# Patient Record
Sex: Male | Born: 1965 | Race: White | Hispanic: No | Smoking: Current every day smoker
Health system: Southern US, Community
[De-identification: ages and names within clinical notes are randomized; demographics above are authoritative.]

## PROBLEM LIST (undated history)

## (undated) DIAGNOSIS — R161 Splenomegaly, not elsewhere classified: Secondary | ICD-10-CM

## (undated) DIAGNOSIS — K219 Gastro-esophageal reflux disease without esophagitis: Secondary | ICD-10-CM

## (undated) DIAGNOSIS — I10 Essential (primary) hypertension: Secondary | ICD-10-CM

## (undated) DIAGNOSIS — K7460 Unspecified cirrhosis of liver: Secondary | ICD-10-CM

## (undated) DIAGNOSIS — K760 Fatty (change of) liver, not elsewhere classified: Secondary | ICD-10-CM

## (undated) HISTORY — DX: Gastro-esophageal reflux disease without esophagitis: K21.9

## (undated) HISTORY — DX: Fatty (change of) liver, not elsewhere classified: K76.0

## (undated) HISTORY — DX: Splenomegaly, not elsewhere classified: R16.1

---

## 2008-04-09 ENCOUNTER — Ambulatory Visit (HOSPITAL_COMMUNITY): Admission: RE | Admit: 2008-04-09 | Discharge: 2008-04-09 | Payer: Self-pay | Admitting: Family Medicine

## 2009-07-13 ENCOUNTER — Emergency Department (HOSPITAL_COMMUNITY): Admission: EM | Admit: 2009-07-13 | Discharge: 2009-07-13 | Payer: Self-pay | Admitting: Emergency Medicine

## 2009-09-16 ENCOUNTER — Ambulatory Visit: Payer: Self-pay | Admitting: Orthopedic Surgery

## 2009-09-16 DIAGNOSIS — G589 Mononeuropathy, unspecified: Secondary | ICD-10-CM | POA: Insufficient documentation

## 2009-09-16 DIAGNOSIS — Z8679 Personal history of other diseases of the circulatory system: Secondary | ICD-10-CM | POA: Insufficient documentation

## 2010-08-22 ENCOUNTER — Ambulatory Visit (HOSPITAL_COMMUNITY): Admission: RE | Admit: 2010-08-22 | Discharge: 2010-08-22 | Payer: Self-pay | Admitting: Urology

## 2010-11-08 ENCOUNTER — Encounter: Payer: Self-pay | Admitting: Urology

## 2012-02-10 ENCOUNTER — Emergency Department (HOSPITAL_COMMUNITY): Payer: 59

## 2012-02-10 ENCOUNTER — Other Ambulatory Visit: Payer: Self-pay

## 2012-02-10 ENCOUNTER — Emergency Department (HOSPITAL_COMMUNITY)
Admission: EM | Admit: 2012-02-10 | Discharge: 2012-02-10 | Disposition: A | Discharge status: Home / Self Care | Payer: 59 | Attending: Emergency Medicine | Admitting: Emergency Medicine

## 2012-02-10 ENCOUNTER — Encounter (HOSPITAL_COMMUNITY): Payer: Self-pay | Admitting: Emergency Medicine

## 2012-02-10 DIAGNOSIS — I10 Essential (primary) hypertension: Secondary | ICD-10-CM | POA: Insufficient documentation

## 2012-02-10 DIAGNOSIS — D696 Thrombocytopenia, unspecified: Secondary | ICD-10-CM | POA: Insufficient documentation

## 2012-02-10 DIAGNOSIS — R002 Palpitations: Secondary | ICD-10-CM | POA: Insufficient documentation

## 2012-02-10 DIAGNOSIS — R7401 Elevation of levels of liver transaminase levels: Secondary | ICD-10-CM | POA: Insufficient documentation

## 2012-02-10 DIAGNOSIS — R197 Diarrhea, unspecified: Secondary | ICD-10-CM | POA: Insufficient documentation

## 2012-02-10 DIAGNOSIS — Z79899 Other long term (current) drug therapy: Secondary | ICD-10-CM | POA: Insufficient documentation

## 2012-02-10 DIAGNOSIS — R7402 Elevation of levels of lactic acid dehydrogenase (LDH): Secondary | ICD-10-CM | POA: Insufficient documentation

## 2012-02-10 HISTORY — DX: Essential (primary) hypertension: I10

## 2012-02-10 LAB — CBC
MCH: 32.3 pg (ref 26.0–34.0)
MCV: 95 fL (ref 78.0–100.0)
Platelets: 80 10*3/uL — ABNORMAL LOW (ref 150–400)
RDW: 12.7 % (ref 11.5–15.5)

## 2012-02-10 LAB — COMPREHENSIVE METABOLIC PANEL
ALT: 84 U/L — ABNORMAL HIGH (ref 0–53)
AST: 152 U/L — ABNORMAL HIGH (ref 0–37)
Calcium: 9.6 mg/dL (ref 8.4–10.5)
Creatinine, Ser: 0.77 mg/dL (ref 0.50–1.35)
GFR calc Af Amer: >90 mL/min (ref >90)
Glucose, Bld: 129 mg/dL — ABNORMAL HIGH (ref 70–99)
Sodium: 139 mEq/L (ref 135–145)
Total Protein: 7.6 g/dL (ref 6.0–8.3)

## 2012-02-10 LAB — DIFFERENTIAL
Basophils Absolute: 0 10*3/uL (ref 0.0–0.1)
Eosinophils Absolute: 0.1 10*3/uL (ref 0.0–0.7)
Eosinophils Relative: 1 % (ref 0–5)
Monocytes Absolute: 0.4 10*3/uL (ref 0.1–1.0)

## 2012-02-10 LAB — TROPONIN I: Troponin I: <0.3 ng/mL (ref <0.30)

## 2012-02-10 MED ORDER — METOPROLOL TARTRATE 1 MG/ML IV SOLN
5.0000 mg | Freq: Once | INTRAVENOUS | Status: DC
  Filled 2012-02-10: qty 5

## 2012-02-10 MED ORDER — CLONIDINE HCL 0.1 MG PO TABS
0.1000 mg | ORAL_TABLET | Freq: Once | ORAL | Status: AC
  Administered 2012-02-10: 0.1 mg via ORAL
  Filled 2012-02-10 (×2): qty 1

## 2012-02-10 MED ORDER — HYDROCHLOROTHIAZIDE 25 MG PO TABS: 25.0000 mg | ORAL_TABLET | Freq: Every day | ORAL | Status: DC

## 2012-02-10 NOTE — ED Notes (Signed)
Pt states he felt like his heart was skipping beats at his childs soccer game.  Pt was not exerting himself at the time

## 2012-02-10 NOTE — ED Provider Notes (Signed)
History   This chart was scribed for Glynn Octave, MD by Melba Coon. The patient was seen in room APA03/APA03 and the patient's care was started at 5:35PM.    CSN: [id number]  Arrival date & time 02/10/12  1717   First MD Initiated Contact with Patient 02/10/12 1726      Chief Complaint  Patient presents with  . Palpitations    (Consider location/radiation/quality/duration/timing/severity/associated sxs/prior treatment) HPI Jacob Lester is a 46 y.o. male who presents to the Emergency Department complaining of intermittent, moderate heart palpitations with an onset an hr ago. Pt states he felt like his heart was skipping beats every few seconds at his daughter's soccer game for 30 min. Pt was not exerting himself at the time. No CP, but felt "emptiness" or "void" in his chest. Pt feels almost back to nml at time of exam but feels dehydrated. Pt has a Hx of HTN and takes meds for it. Nml appetite. Diarrhea 2x today present. No double or bluury vision. No HA, fever, lightheadedness, neck pain, sore throat, rash, back pain, SOB, abd pain, nausea, emesis, dysuria, or extremity pain, edema, weakness, numbness, or tingling. No family Hx of heart problems; no stress test done. No known allergies. No other pertinent medical symptoms. Pt is an occasional smoker.  PCP: Isac Sarna  Past Medical History  Diagnosis Date  . Hypertension     History reviewed. No pertinent past surgical history.  No family history on file.  History  Substance Use Topics  . Smoking status: Passive Smoker  . Smokeless tobacco: Not on file  . Alcohol Use: Yes      Review of Systems 10 Systems reviewed and all are negative for acute change except as noted in the HPI.   Allergies  Review of patient's allergies indicates no known allergies.  Home Medications   Current Outpatient Rx  Name Route Sig Dispense Refill  . ASPIRIN EC 81 MG PO TBEC Oral Take 81 mg by mouth daily.    Marland Kitchen FLAXSEED OIL PO  Oral Take 1 tablet by mouth daily.    Marland Kitchen METOPROLOL SUCCINATE ER 100 MG PO TB24 Oral Take 100 mg by mouth daily.    Marland Kitchen NAPHAZOLINE HCL 0.012 % OP SOLN Both Eyes Place 1 drop into both eyes daily.    Marland Kitchen FISH OIL 1200 MG PO CAPS Oral Take 1 capsule by mouth daily.    Marland Kitchen OMEPRAZOLE 20 MG PO CPDR Oral Take 20 mg by mouth every other day.      BP 200/106  Pulse 76  Temp(Src) 98.3 F (36.8 C) (Oral)  Resp 13  Ht 6' (1.829 m)  Wt 224 lb (101.606 kg)  BMI 30.38 kg/m2  SpO2 98%  Physical Exam  Nursing note and vitals reviewed. Constitutional: He is oriented to person, place, and time. He appears well-developed and well-nourished.       Awake, alert, nontoxic appearance with baseline speech for patient.  HENT:  Head: Normocephalic and atraumatic.  Mouth/Throat: No oropharyngeal exudate.  Eyes: EOM are normal. Pupils are equal, round, and reactive to light. Right eye exhibits no discharge. Left eye exhibits no discharge.       No nystagmus  Neck: Normal range of motion. Neck supple.  Cardiovascular: Normal rate, regular rhythm and normal heart sounds.   No murmur heard. Pulmonary/Chest: Effort normal and breath sounds normal. No stridor. No respiratory distress. He has no wheezes. He has no rales. He exhibits no tenderness.  Abdominal: Soft. Bowel sounds are  normal. He exhibits no mass. There is no tenderness. There is no rebound.  Musculoskeletal: He exhibits no edema and no tenderness.       Baseline ROM, moves extremities with no obvious new focal weakness.  Lymphadenopathy:    He has no cervical adenopathy.  Neurological: He is alert and oriented to person, place, and time.       Awake, alert, cooperative and aware of situation; motor strength bilaterally; sensation normal to light touch bilaterally; peripheral visual fields full to confrontation; no facial asymmetry; tongue midline; major cranial nerves appear intact; normal finger to nose bilaterally.  Skin: Skin is warm. No rash noted.    Psychiatric: He has a normal mood and affect. His behavior is normal.    ED Course  Procedures (including critical care time) DIAGNOSTIC STUDIES: Oxygen Saturation is 98% on room air, normal by my interpretation.    COORDINATION OF CARE:  5:40PM - Pt will order CXR and blood w/u for the pt and multiple BP readings with careful observation. 7:11PM - recheck; blood tests show elevated AST and ALT levels with a AST:ALT ratio of about 2, indicating possible alcohol hepatitis; pt was asked about his alcohol intake in which pt admitted to drinking about 4 beers a night every night and is aware of his liver problems. Pt advised to reduce his alcohol consumption.   Labs Reviewed  CBC - Abnormal; Notable for the following:    Platelets 80 (*)    All other components within normal limits  COMPREHENSIVE METABOLIC PANEL - Abnormal; Notable for the following:    Glucose, Bld 129 (*)    AST 152 (*)    ALT 84 (*)    All other components within normal limits  DIFFERENTIAL  TROPONIN I  TSH  T4, FREE   Dg Chest 2 View  02/10/2012  *RADIOLOGY REPORT*  Clinical Data: 46 year old male with palpitations and hypertension.  CHEST - 2 VIEW  Comparison: None.  Findings: Lung volumes are within normal limits.  Cardiac size and mediastinal contours are within normal limits.  Visualized tracheal air column is within normal limits.  The lungs are clear.  No pneumothorax, or pleural effusion. No acute osseous abnormality identified.  IMPRESSION: Negative, no acute cardiopulmonary abnormality.  Original Report Authenticated By: Harley Hallmark, M.D.     No diagnosis found.    MDM  Sensation of heart skipping beats with "void" in the throat area while watching daughter play soccer. Lasted about 30 minutes. No chest pain, shortness of breath, nausea or vomiting Electrolytes normal, EKG was sinus arrhythmia, troponin negative  Mild transaminase elevation noted. Patient states he's had this in the past and  been denied life insurance over but did not have it evaluated. AST is twice greater than ALT in the pattern of alcoholic hepatitis. His thrombocytopenia 80 also suggest chronic alcohol abuse. Patient confronted with these findings he states juice about 4 beers a night every night. I advised them this is likely the source of his transaminitis and thrombocytopenia and cautioned him to reduce his alcohol intake.  His blood pressure remains high 200/100. She takes 100 mg of Lopressor once daily. He states compliance. He denies any chest pain, shortness of breath, headache, vision change. I advised him to call his Dr. Murvin Natal first thing in the morning to be seen for his blood pressure and transaminitis issues. He'll be referred to cardiology for possible Holter monitor patient placement as an outpatient.   Date: 02/10/2012  Rate: 74  Rhythm:  sinus arrhythmia  QRS Axis: normal  Intervals: normal  ST/T Wave abnormalities: normal  Conduction Disutrbances:none  Narrative Interpretation: Left ventricular hypertrophy  Old EKG Reviewed: none available   I personally performed the services described in this documentation, which was scribed in my presence.  The recorded information has been reviewed and considered.       Glynn Octave, MD 02/11/12 [phone]

## 2012-02-10 NOTE — ED Notes (Signed)
MD at bedside. 

## 2012-02-10 NOTE — Discharge Instructions (Signed)
Arterial Hypertension Call your doctor tomorrow regarding your high blood pressure, elevated liver function studies and low platelets. Return to the ED he developed chest pain, shortness of breath, headache, visual change, any other concerning symptoms Arterial hypertension (high blood pressure) is a condition of elevated pressure in your blood vessels. Hypertension over a long period of time is a risk factor for strokes, heart attacks, and heart failure. It is also the leading cause of kidney (renal) failure.  CAUSES   In Adults -- Over 90% of all hypertension has no known cause. This is called essential or primary hypertension. In the other 10% of people with hypertension, the increase in blood pressure is caused by another disorder. This is called secondary hypertension. Important causes of secondary hypertension are:   Heavy alcohol use.   Obstructive sleep apnea.   Hyperaldosterosim (Conn's syndrome).   Steroid use.   Chronic kidney failure.   Hyperparathyroidism.   Medications.   Renal artery stenosis.   Pheochromocytoma.   Cushing's disease.   Coarctation of the aorta.   Scleroderma renal crisis.   Licorice (in excessive amounts).   Drugs (cocaine, methamphetamine).  Your caregiver can explain any items above that apply to you.  In Children -- Secondary hypertension is more common and should always be considered.   Pregnancy -- Few women of childbearing age have high blood pressure. However, up to 10% of them develop hypertension of pregnancy. Generally, this will not harm the woman. It may be a sign of 3 complications of pregnancy: preeclampsia, HELLP syndrome, and eclampsia. Follow up and control with medication is necessary.  SYMPTOMS   This condition normally does not produce any noticeable symptoms. It is usually found during a routine exam.   Malignant hypertension is a late problem of high blood pressure. It may have the following symptoms:   Headaches.    Blurred vision.   End-organ damage (this means your kidneys, heart, lungs, and other organs are being damaged).   Stressful situations can increase the blood pressure. If a person with normal blood pressure has their blood pressure go up while being seen by their caregiver, this is often termed "white coat hypertension." Its importance is not known. It may be related with eventually developing hypertension or complications of hypertension.   Hypertension is often confused with mental tension, stress, and anxiety.  DIAGNOSIS  The diagnosis is made by 3 separate blood pressure measurements. They are taken at least 1 week apart from each other. If there is organ damage from hypertension, the diagnosis may be made without repeat measurements. Hypertension is usually identified by having blood pressure readings:  Above 140/90 mmHg measured in both arms, at 3 separate times, over a couple weeks.   Over 130/80 mmHg should be considered a risk factor and may require treatment in patients with diabetes.  Blood pressure readings over 120/80 mmHg are called "pre-hypertension" even in non-diabetic patients. To get a true blood pressure measurement, use the following guidelines. Be aware of the factors that can alter blood pressure readings.  Take measurements at least 1 hour after caffeine.   Take measurements 30 minutes after smoking and without any stress. This is another reason to quit smoking - it raises your blood pressure.   Use a proper cuff size. Ask your caregiver if you are not sure about your cuff size.   Most home blood pressure cuffs are automatic. They will measure systolic and diastolic pressures. The systolic pressure is the pressure reading at the start of  sounds. Diastolic pressure is the pressure at which the sounds disappear. If you are elderly, measure pressures in multiple postures. Try sitting, lying or standing.   Sit at rest for a minimum of 5 minutes before taking  measurements.   You should not be on any medications like decongestants. These are found in many cold medications.   Record your blood pressure readings and review them with your caregiver.  If you have hypertension:  Your caregiver may do tests to be sure you do not have secondary hypertension (see "causes" above).   Your caregiver may also look for signs of metabolic syndrome. This is also called Syndrome X or Insulin Resistance Syndrome. You may have this syndrome if you have type 2 diabetes, abdominal obesity, and abnormal blood lipids in addition to hypertension.   Your caregiver will take your medical and family history and perform a physical exam.   Diagnostic tests may include blood tests (for glucose, cholesterol, potassium, and kidney function), a urinalysis, or an EKG. Other tests may also be necessary depending on your condition.  PREVENTION  There are important lifestyle issues that you can adopt to reduce your chance of developing hypertension:  Maintain a normal weight.   Limit the amount of salt (sodium) in your diet.   Exercise often.   Limit alcohol intake.   Get enough potassium in your diet. Discuss specific advice with your caregiver.   Follow a DASH diet (dietary approaches to stop hypertension). This diet is rich in fruits, vegetables, and low-fat dairy products, and avoids certain fats.  PROGNOSIS  Essential hypertension cannot be cured. Lifestyle changes and medical treatment can lower blood pressure and reduce complications. The prognosis of secondary hypertension depends on the underlying cause. Many people whose hypertension is controlled with medicine or lifestyle changes can live a normal, healthy life.  RISKS AND COMPLICATIONS  While high blood pressure alone is not an illness, it often requires treatment due to its short- and long-term effects on many organs. Hypertension increases your risk for:  CVAs or strokes (cerebrovascular accident).   Heart  failure due to chronically high blood pressure (hypertensive cardiomyopathy).   Heart attack (myocardial infarction).   Damage to the retina (hypertensive retinopathy).   Kidney failure (hypertensive nephropathy).  Your caregiver can explain list items above that apply to you. Treatment of hypertension can significantly reduce the risk of complications. TREATMENT   For overweight patients, weight loss and regular exercise are recommended. Physical fitness lowers blood pressure.   Mild hypertension is usually treated with diet and exercise. A diet rich in fruits and vegetables, fat-free dairy products, and foods low in fat and salt (sodium) can help lower blood pressure. Decreasing salt intake decreases blood pressure in a 1/3 of people.   Stop smoking if you are a smoker.  The steps above are highly effective in reducing blood pressure. While these actions are easy to suggest, they are difficult to achieve. Most patients with moderate or severe hypertension end up requiring medications to bring their blood pressure down to a normal level. There are several classes of medications for treatment. Blood pressure pills (antihypertensives) will lower blood pressure by their different actions. Lowering the blood pressure by 10 mmHg may decrease the risk of complications by as much as 25%. The goal of treatment is effective blood pressure control. This will reduce your risk for complications. Your caregiver will help you determine the best treatment for you according to your lifestyle. What is excellent treatment for one person,  may not be for you. HOME CARE INSTRUCTIONS   Do not smoke.   Follow the lifestyle changes outlined in the "Prevention" section.   If you are on medications, follow the directions carefully. Blood pressure medications must be taken as prescribed. Skipping doses reduces their benefit. It also puts you at risk for problems.   Follow up with your caregiver, as directed.   If  you are asked to monitor your blood pressure at home, follow the guidelines in the "Diagnosis" section above.  SEEK MEDICAL CARE IF:   You think you are having medication side effects.   You have recurrent headaches or lightheadedness.   You have swelling in your ankles.   You have trouble with your vision.  SEEK IMMEDIATE MEDICAL CARE IF:   You have sudden onset of chest pain or pressure, difficulty breathing, or other symptoms of a heart attack.   You have a severe headache.   You have symptoms of a stroke (such as sudden weakness, difficulty speaking, difficulty walking).  MAKE SURE YOU:   Understand these instructions.   Will watch your condition.   Will get help right away if you are not doing well or get worse.  Document Released: 10/05/2005 Document Revised: 09/24/2011 Document Reviewed: 05/05/2007 [facility] Patient Information 2012 [place], [place].Alcohol Problems Most adults who drink alcohol drink in moderation (not a lot) are at low risk for developing problems related to their drinking. However, all drinkers, including low-risk drinkers, should know about the health risks connected with drinking alcohol. RECOMMENDATIONS FOR LOW-RISK DRINKING  Drink in moderation. Moderate drinking is defined as follows:   Men - no more than 2 drinks per day.   Nonpregnant women - no more than 1 drink per day.   Over age 53 - no more than 1 drink per day.  A standard drink is 12 grams of pure alcohol, which is equal to a 12 ounce bottle of beer or wine cooler, a 5 ounce glass of wine, or 1.5 ounces of distilled spirits (such as whiskey, brandy, vodka, or rum).  ABSTAIN FROM (DO NOT DRINK) ALCOHOL:  When pregnant or considering pregnancy.   When taking a medication that interacts with alcohol.   If you are alcohol dependent.   A medical condition that prohibits drinking alcohol (such as ulcer, liver disease, or heart disease).  DISCUSS WITH YOUR CAREGIVER:  If you are at risk  for coronary heart disease, discuss the potential benefits and risks of alcohol use: Light to moderate drinking is associated with lower rates of coronary heart disease in certain populations (for example, men over age 23 and postmenopausal women). Infrequent or nondrinkers are advised not to begin light to moderate drinking to reduce the risk of coronary heart disease so as to avoid creating an alcohol-related problem. Similar protective effects can likely be gained through proper diet and exercise.   Women and the elderly have smaller amounts of body water than men. As a result women and the elderly achieve a higher blood alcohol concentration after drinking the same amount of alcohol.   Exposing a fetus to alcohol can cause a broad range of birth defects referred to as Fetal Alcohol Syndrome (FAS) or Alcohol-Related Birth Defects (ARBD). Although FAS/ARBD is connected with excessive alcohol consumption during pregnancy, studies also have reported neurobehavioral problems in infants born to mothers reporting drinking an average of 1 drink per day during pregnancy.   Heavier drinking (the consumption of more than 4 drinks per occasion by men and more  than 3 drinks per occasion by women) impairs learning (cognitive) and psychomotor functions and increases the risk of alcohol-related problems, including accidents and injuries.  CAGE QUESTIONS:   Have you ever felt that you should Cut down on your drinking?   Have people Annoyed you by criticizing your drinking?   Have you ever felt bad or Guilty about your drinking?   Have you ever had a drink first thing in the morning to steady your nerves or get rid of a hangover (Eye opener)?  If you answered positively to any of these questions: You may be at risk for alcohol-related problems if alcohol consumption is:   Men: Greater than 14 drinks per week or more than 4 drinks per occasion.   Women: Greater than 7 drinks per week or more than 3 drinks per  occasion.  Do you or your family have a medical history of alcohol-related problems, such as:  Blackouts.   Sexual dysfunction.   Depression.   Trauma.   Liver dysfunction.   Sleep disorders.   Hypertension.   Chronic abdominal pain.   Has your drinking ever caused you problems, such as problems with your family, problems with your work (or school) performance, or accidents/injuries?   Do you have a compulsion to drink or a preoccupation with drinking?   Do you have poor control or are you unable to stop drinking once you have started?   Do you have to drink to avoid withdrawal symptoms?   Do you have problems with withdrawal such as tremors, nausea, sweats, or mood disturbances?   Does it take more alcohol than in the past to get you high?   Do you feel a strong urge to drink?   Do you change your plans so that you can have a drink?   Do you ever drink in the morning to relieve the shakes or a hangover?  If you have answered a number of the previous questions positively, it may be time for you to talk to your caregivers, family, and friends and see if they think you have a problem. Alcoholism is a chemical dependency that keeps getting worse and will eventually destroy your health and relationships. Many alcoholics end up dead, impoverished, or in prison. This is often the end result of all chemical dependency.  Do not be discouraged if you are not ready to take action immediately.   Decisions to change behavior often involve up and down desires to change and feeling like you cannot decide.   Try to think more seriously about your drinking behavior.   Think of the reasons to quit.  WHERE TO GO FOR ADDITIONAL INFORMATION   The National Institute on Alcohol Abuse and Alcoholism (NIAAA)www.niaaa.nih.gov   [employer] on Alcoholism and Drug Dependence (NCADD)www.ncadd.org   American Society of Addiction Medicine (ASAM)www.https://anderson-johnson.com/  Document Released: 10/05/2005  Document Revised: 09/24/2011 Document Reviewed: 05/23/2008 [facility] Patient Information 2012 [place], [place].

## 2012-02-11 LAB — T4, FREE: Free T4: 1.36 ng/dL (ref 0.80–1.80)

## 2012-02-11 NOTE — ED Notes (Addendum)
Patient given cyclobenzaprine 10mg  - MD advised and patient informed.

## 2012-03-07 ENCOUNTER — Other Ambulatory Visit (HOSPITAL_COMMUNITY): Payer: Self-pay | Admitting: Family Medicine

## 2012-03-07 DIAGNOSIS — D473 Essential (hemorrhagic) thrombocythemia: Secondary | ICD-10-CM

## 2012-03-07 DIAGNOSIS — R7301 Impaired fasting glucose: Secondary | ICD-10-CM

## 2012-03-07 DIAGNOSIS — I10 Essential (primary) hypertension: Secondary | ICD-10-CM

## 2012-03-08 ENCOUNTER — Telehealth (HOSPITAL_COMMUNITY): Payer: Self-pay

## 2012-03-08 NOTE — Telephone Encounter (Signed)
Spoke with patient and knows to come for appointment with Dr. Mariel Sleet on 5/28 @ 8:10am.  Appointment for 6/3 cancelled.

## 2012-03-09 ENCOUNTER — Ambulatory Visit (HOSPITAL_COMMUNITY)
Admission: RE | Admit: 2012-03-09 | Discharge: 2012-03-09 | Disposition: A | Discharge status: Home / Self Care | Payer: BC Managed Care – PPO | Source: Ambulatory Visit | Attending: Family Medicine | Admitting: Family Medicine

## 2012-03-09 DIAGNOSIS — R7301 Impaired fasting glucose: Secondary | ICD-10-CM | POA: Insufficient documentation

## 2012-03-09 DIAGNOSIS — K8020 Calculus of gallbladder without cholecystitis without obstruction: Secondary | ICD-10-CM | POA: Insufficient documentation

## 2012-03-09 DIAGNOSIS — K7689 Other specified diseases of liver: Secondary | ICD-10-CM | POA: Insufficient documentation

## 2012-03-09 DIAGNOSIS — D473 Essential (hemorrhagic) thrombocythemia: Secondary | ICD-10-CM | POA: Insufficient documentation

## 2012-03-09 DIAGNOSIS — I10 Essential (primary) hypertension: Secondary | ICD-10-CM | POA: Insufficient documentation

## 2012-03-09 DIAGNOSIS — R748 Abnormal levels of other serum enzymes: Secondary | ICD-10-CM | POA: Insufficient documentation

## 2012-03-11 ENCOUNTER — Encounter (HOSPITAL_COMMUNITY): Payer: Self-pay | Admitting: Oncology

## 2012-03-11 ENCOUNTER — Encounter (HOSPITAL_COMMUNITY): Payer: BC Managed Care – PPO | Attending: Oncology | Admitting: Oncology

## 2012-03-11 VITALS — BP 172/93 | HR 68 | Temp 98.2°F | Ht 72.0 in | Wt 219.8 lb

## 2012-03-11 DIAGNOSIS — R748 Abnormal levels of other serum enzymes: Secondary | ICD-10-CM | POA: Insufficient documentation

## 2012-03-11 DIAGNOSIS — R161 Splenomegaly, not elsewhere classified: Secondary | ICD-10-CM | POA: Insufficient documentation

## 2012-03-11 DIAGNOSIS — K219 Gastro-esophageal reflux disease without esophagitis: Secondary | ICD-10-CM | POA: Insufficient documentation

## 2012-03-11 DIAGNOSIS — K7689 Other specified diseases of liver: Secondary | ICD-10-CM | POA: Insufficient documentation

## 2012-03-11 DIAGNOSIS — E663 Overweight: Secondary | ICD-10-CM | POA: Insufficient documentation

## 2012-03-11 DIAGNOSIS — I10 Essential (primary) hypertension: Secondary | ICD-10-CM | POA: Insufficient documentation

## 2012-03-11 DIAGNOSIS — F3289 Other specified depressive episodes: Secondary | ICD-10-CM | POA: Insufficient documentation

## 2012-03-11 DIAGNOSIS — F329 Major depressive disorder, single episode, unspecified: Secondary | ICD-10-CM | POA: Insufficient documentation

## 2012-03-11 DIAGNOSIS — D696 Thrombocytopenia, unspecified: Secondary | ICD-10-CM | POA: Insufficient documentation

## 2012-03-11 DIAGNOSIS — F411 Generalized anxiety disorder: Secondary | ICD-10-CM | POA: Insufficient documentation

## 2012-03-11 DIAGNOSIS — F1010 Alcohol abuse, uncomplicated: Secondary | ICD-10-CM | POA: Insufficient documentation

## 2012-03-11 LAB — DIFFERENTIAL
Eosinophils Relative: 3 % (ref 0–5)
Lymphs Abs: 2.1 10*3/uL (ref 0.7–4.0)
Neutrophils Relative %: 49 % (ref 43–77)

## 2012-03-11 LAB — CBC
MCH: 32.9 pg (ref 26.0–34.0)
MCHC: 35.8 g/dL (ref 30.0–36.0)
MCV: 92.1 fL (ref 78.0–100.0)
Platelets: 87 10*3/uL — ABNORMAL LOW (ref 150–400)
RDW: 13.2 % (ref 11.5–15.5)

## 2012-03-11 LAB — SEDIMENTATION RATE: Sed Rate: 2 mm/hr (ref 0–16)

## 2012-03-11 LAB — HEPATIC FUNCTION PANEL
Albumin: 4.2 g/dL (ref 3.5–5.2)
Indirect Bilirubin: 1 mg/dL — ABNORMAL HIGH (ref 0.3–0.9)
Total Protein: 8.1 g/dL (ref 6.0–8.3)

## 2012-03-11 LAB — GAMMA GT: GGT: 361 U/L — ABNORMAL HIGH (ref 7–51)

## 2012-03-11 NOTE — Progress Notes (Signed)
This office note has been dictated.

## 2012-03-11 NOTE — Progress Notes (Signed)
CC:   Jacob Lester, M.D.  DIAGNOSES: 1. Thrombocytopenia in the setting of abnormal liver enzymes and both     clinical and radiographic evidence for splenomegaly. 2. Fatty infiltration of the liver. 3. Excessive weight, weighing 219 pounds on a 6 foot 0 inch frame, and     his body mass index is 29.9. 4. Excessive use of alcohol at this point in time, drinking 6 beers a     day or 3 cocktails every evening. 5. Anxiety and mild depression over the illness of his mother, who has     metastatic cancer. 6. Hypertension, on hydrochlorothiazide 25 mg a day. 7. Gastroesophageal reflux disease, occasionally on omeprazole, and he     also takes Benicar HCT for his hypertension. This is a very pleasant gentleman, who I have met before.  His mother is my patient and a delightful woman.  He is a very pleasant gentleman, who was found by Dr. Phillips Odor to have abnormal blood work in the form of thrombocytopenia with platelets of less than 100,000 recently, but more interestingly, he had liver enzymes which were abnormal as far as SGOT, SGPT and a gamma GT.  He is not having bleeding issues from the nose, gums, GI or GU tracts. He feels fairly well, but he is a little down about his mother.  He has not had abdominal surgery but he has had 2 episodes of diverticulitis, he states.  He had an ultrasound of his abdomen, which specifically did not reveal "splenomegaly."  He had cholelithiasis, but he had CAT scans in 2011 and using measurements, he definitely has splenomegaly on that evaluation, plus he had fatty infiltration of the liver at that time as well, on this ultrasound evaluation he also had fatty infiltration of his liver seen diffusely by Dr. Molli Posey in radiology.  He does not smoke.  He works full-time.  He has basically a sit-down job.  His is very busy, however, but does not get any exercise to speak of and actually weighed a little bit more at 1 time, up to about 230 pounds, but  is down to 219 pounds, in high school weighed about 165 pounds.  PHYSICAL EXAMINATION:  Very, pleasant gentleman, alert, oriented, in no acute distress.  He denies any pain.  His weight is 219 pounds on a 6 foot 0 inch frame.  Blood pressure 172/93, but he is anxious today.  I tis in the right arm, sitting position.  Pulse 68 and regular, respirations 16 and unlabored.  He is afebrile.  Lymph nodes:  Negative throughout.  Lungs:  Clear.  Heart:  Shows a regular rhythm and rate without murmur, rub or gallop.  Breast exam shows no gynecomastia. Abdomen:  Soft and nontender, but he has a spleen which is 3-4 cm below the costal margin in the supine position, easily felt in the left upper quadrant.  His liver comes down about 2 cm below the right costal margin on deep inspiration as well.  He has no peripheral edema, no petechiae, no ecchymoses.  Bowel sounds are normal.  He has a normal HEENT exam. Throat is clear.  No petechiae orally.  Pupils equally round and reactive to light.  There are no conjunctival petechiae.  He has no thyromegaly.  Teeth in good repair.  I repeated his labs today, which still show thrombocytopenia with a level of 87,000, normal hemoglobin, normal white count, normal differential.  Sed rate of 2.  Normal B12 level, normal folic acid level,  normal ferritin; in fact, it is slightly high at 327, interestingly.  Here again, his GGT is high at 361.  Total bilirubin actually is high at 1.4, that is minimally elevated compared to his past levels, which were basically within the normal range from Dr. Lamar Blinks labs; and his SGOT is 120, SGPT 71, both higher than they were earlier this month.  His total protein is 8.1, his albumin 4.2.  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  He may also have a contributing factor of alcohol toxicity to his bone marrow that may be very  real.  What I have asked him to do is: 1. Think about seeing a nutritionist for weight reduction, dietary     changes and lifestyle changes. 2. Exercise. 3. Abstinence from alcohol for at least 8 weeks and bring him back to     see me in July with a repeat CBC, and I think he needs to be     considered for a GI consultation with Dr. Lionel December. If he cannot get his weight off and this splenomegaly and fatty infiltration of the liver continue, I think he is a prime candidate for cirrhosis of the liver.  So we will see him back in July and I will consider sending him to Dr. Karilyn Cota after I see him.    ______________________________ Ladona Horns. Mariel Sleet, MD ESN/MEDQ  D:  03/11/2012  T:  03/11/2012  Job:  [id number]

## 2012-03-11 NOTE — Patient Instructions (Addendum)
BRAYLON GRENDA  [id number] [birth year] Dr. Glenford Peers   [facility]  Discharge Instructions  RECOMMENDATIONS MADE BY THE CONSULTANT AND ANY TEST RESULTS WILL BE SENT TO YOUR REFERRING DOCTOR.   EXAM FINDINGS BY MD TODAY AND SIGNS AND SYMPTOMS TO REPORT TO CLINIC OR PRIMARY MD: Exam and discussion per MD.  We will make a nutritional consult regarding life style changes and good nutrition.  Avoid alcohol.  MEDICATIONS PRESCRIBED: none    INSTRUCTIONS GIVEN AND DISCUSSED: Other :  Report unusual bruising or bleeding.  If you have bleeding that is not controlled by direct pressure go to ED  SPECIAL INSTRUCTIONS/FOLLOW-UP: Lab work Needed in July and Return to Clinic on after blood work in July.   I acknowledge that I have been informed and understand all the instructions given to me and received a copy. I do not have any more questions at this time, but understand that I may call the Specialty Clinic at [facility] at [phone] during business hours should I have any further questions or need assistance in obtaining follow-up care.    __________________________________________  _____________  __________ Signature of Patient or Authorized Representative            Date                   Time    __________________________________________ Nurse's Signature

## 2012-03-15 ENCOUNTER — Ambulatory Visit (HOSPITAL_COMMUNITY): Payer: 59 | Admitting: Oncology

## 2012-03-15 ENCOUNTER — Telehealth (HOSPITAL_COMMUNITY): Payer: Self-pay | Admitting: Dietician

## 2012-03-15 LAB — HEPATITIS PANEL, ACUTE
HCV Ab: NEGATIVE
Hep A IgM: NEGATIVE

## 2012-03-15 NOTE — Telephone Encounter (Signed)
Received referral via fax from Dr. Mariel Sleet [facility]) for dx: lifestyle changes and nutrition.

## 2012-03-15 NOTE — Progress Notes (Signed)
Will also check alpha feto protein in July.

## 2012-03-15 NOTE — Telephone Encounter (Signed)
Appointment scheduled for 03/25/12 at 10:30 AM.

## 2012-03-17 ENCOUNTER — Telehealth (HOSPITAL_COMMUNITY): Payer: Self-pay | Admitting: Dietician

## 2012-03-17 NOTE — Telephone Encounter (Signed)
Mailed confirmation letter and instructions for appointment scheduled for 03/25/12 at 10:30 AM to pt home vis [place] [facility].

## 2012-03-21 ENCOUNTER — Ambulatory Visit (HOSPITAL_COMMUNITY): Payer: 59

## 2012-03-25 ENCOUNTER — Encounter (HOSPITAL_COMMUNITY): Payer: Self-pay | Admitting: Dietician

## 2012-03-25 NOTE — Progress Notes (Signed)
Outpatient Initial Nutrition Assessment  Date:03/25/2012   Time: 10:30 AM  Referring Physician: Dr. Mariel Sleet Reason for Visit: "lifestyle changes and nutrition"  Nutrition Assessment:  Height: 6\' 1"  (185.4 cm)   Weight: 218 lb (98.884 kg)   IBW: 184# %IBW: 118% UBW: 225# %UBW: 103% Body mass index is 28.76 kg/(m^2).  Goal Weight: 200# Weight hx: Pt reports UBW between 223-225#. He reports his heaviest weight was 234# about 1 year ago. His lowest weight was 175-180, about 25 years ago.   Estimated nutritional needs: 2242-2446 kcals daily, 79-99 grams protein daily, 2.2-2.4 L fluid daily  PMH:  Past Medical History  Diagnosis Date  . Hypertension     Medications:  Current Outpatient Rx  Name Route Sig Dispense Refill  . ASPIRIN EC 81 MG PO TBEC Oral Take 81 mg by mouth daily.    Marland Kitchen DIAZEPAM 2 MG PO TABS Oral Take 2 mg by mouth daily.    Marland Kitchen FLAXSEED OIL PO Oral Take 1 tablet by mouth daily.    Marland Kitchen HYDROCHLOROTHIAZIDE 25 MG PO TABS Oral Take 1 tablet (25 mg total) by mouth daily. 10 tablet 0  . METOPROLOL SUCCINATE ER 100 MG PO TB24 Oral Take 100 mg by mouth daily.    Marland Kitchen NAPHAZOLINE HCL 0.012 % OP SOLN Both Eyes Place 1 drop into both eyes daily.    Marland Kitchen OLMESARTAN MEDOXOMIL-HCTZ 40-25 MG PO TABS Oral Take 1 tablet by mouth daily.    Marland Kitchen FISH OIL 1200 MG PO CAPS Oral Take 1 capsule by mouth daily.    Marland Kitchen OMEPRAZOLE 20 MG PO CPDR Oral Take 20 mg by mouth every other day.      Labs: CMP     Component Value Date/Time   NA 139 02/10/2012 1747   K 4.1 02/10/2012 1747   CL 98 02/10/2012 1747   CO2 30 02/10/2012 1747   GLUCOSE 129* 02/10/2012 1747   BUN 12 02/10/2012 1747   CREATININE 0.77 02/10/2012 1747   CALCIUM 9.6 02/10/2012 1747   PROT 8.1 03/11/2012 0815   ALBUMIN 4.2 03/11/2012 0815   AST 120* 03/11/2012 0815   ALT 71* 03/11/2012 0815   ALKPHOS 75 03/11/2012 0815   BILITOT 1.4* 03/11/2012 0815   GFRNONAA >90 02/10/2012 1747   GFRAA >90 02/10/2012 1747    Lipid Panel  No results found for  this basename: chol, trig, hdl, cholhdl, vldl, ldlcalc     No results found for this basename: HGBA1C   Lab Results  Component Value Date   CREATININE 0.77 02/10/2012     Lifestyle/ social habits: Mr. Bilyk is a very pleasant gentleman who lives in [place] with his wife, Sue Lush (who is present with him today) and 3 daughters (ages 3, 55, and 6). 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 He does not smoke, but admits to daily alcohol use, which amount varies per day (most recent doctor's note reports as much as 6 beers a day or 3 cocktails an evening). He reports his stress level as an 8, citing work and providing for his family as his main sources of stress.   Nutrition hx/habits: Mr. Wolk reports his biggest barrier to portion control. He reports he eats healthy, but eats too much. He reports that he recently has been trying to stay away from starchy foods and "bad carbs like potatoes". He is concerned about carbohydrate contents in foods as he has a family hx of diabetes and cancer. He reports he does not currently exercise, but used  to in the past (mostly cardio). 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 He mainly drinks water. He mainly eats chicken, fish, and vegetables. He reports he buys free range eggs and buys all of his beef in bulk once a year from a farmer in [place]. He has refrained from eating fast food and soft drinks. He eats out about once a week, at either a [place] or [address]. He extremely excited about his upcoming trip to [place]. [place] for 8 days, where his family is renting a house and they will be preparing all their food.   Diet recall: Breakfast (8-10 AM): 2 eggs, piece of sausage, banana, apple, water; Lunch (1:30-2 PM): chilled chicken and vegetables; Dinner: same as lunch  Nutrition Diagnosis: Nutrition knowledge deficit r/t weight loss AEB pt and wife with multiple diet related questions.   Nutrition Intervention: Nutrition rx: 1800 kcal NAS, no added sugar diet; 3 meals  per day; low calorie beverages only; 30 minutes physical activity daily; limit alcoholic beverages and sweets  Education/Counseling Provided: Educated pt and pt wife on weight loss principles. Discussed slow, moderate weight loss of 1-2# per week. Also discussed how small amounts of weight loss (5-10% total weight) can have a large impact on health. Discussed importance of regular physical activity along with a healthy diet to assist with weight loss. Emphasized plate method. Discussed portion control, healthy food preparation methods, whole grains, low fat dairy, and incorporating more fruits and vegetables in diet. Discussed snack ideas. Showed pt functionality of MyFitnessPal app. Provided plate method handout.   Understanding, Motivation, Ability to Follow Recommendations: Expect fair to good compliance.   Monitoring and Evaluation: Goals: 1) 1-2# weight loss per week; 2) 30 minutes physical activity daily  Recommendations: 1) For weight loss: 1742-1946 kcals daily; 2) Find an enjoyable exercise and make it a priority; 3) Keep food diary (ex. MyFitnessPal)  F/U: PRN. Provided RD contact information.   Orlene Plum, RD  03/25/2012  Time: 10:30 AM

## 2012-05-09 ENCOUNTER — Other Ambulatory Visit (HOSPITAL_COMMUNITY): Payer: BC Managed Care – PPO

## 2012-05-11 ENCOUNTER — Ambulatory Visit (HOSPITAL_COMMUNITY): Payer: BC Managed Care – PPO | Admitting: Oncology

## 2012-05-17 ENCOUNTER — Telehealth (HOSPITAL_COMMUNITY): Payer: Self-pay | Admitting: Oncology

## 2012-06-10 ENCOUNTER — Encounter (HOSPITAL_COMMUNITY): Payer: BC Managed Care – PPO | Attending: Oncology

## 2012-06-10 DIAGNOSIS — K760 Fatty (change of) liver, not elsewhere classified: Secondary | ICD-10-CM

## 2012-06-10 DIAGNOSIS — R599 Enlarged lymph nodes, unspecified: Secondary | ICD-10-CM | POA: Insufficient documentation

## 2012-06-10 DIAGNOSIS — K219 Gastro-esophageal reflux disease without esophagitis: Secondary | ICD-10-CM | POA: Insufficient documentation

## 2012-06-10 DIAGNOSIS — D696 Thrombocytopenia, unspecified: Secondary | ICD-10-CM | POA: Insufficient documentation

## 2012-06-10 DIAGNOSIS — R161 Splenomegaly, not elsewhere classified: Secondary | ICD-10-CM | POA: Insufficient documentation

## 2012-06-10 DIAGNOSIS — K7460 Unspecified cirrhosis of liver: Secondary | ICD-10-CM | POA: Insufficient documentation

## 2012-06-10 DIAGNOSIS — I10 Essential (primary) hypertension: Secondary | ICD-10-CM | POA: Insufficient documentation

## 2012-06-10 LAB — GAMMA GT: GGT: 310 U/L — ABNORMAL HIGH (ref 7–51)

## 2012-06-10 LAB — CBC
Hemoglobin: 15.5 g/dL (ref 13.0–17.0)
MCV: 90.1 fL (ref 78.0–100.0)
Platelets: 83 10*3/uL — ABNORMAL LOW (ref 150–400)
RBC: 4.74 MIL/uL (ref 4.22–5.81)
WBC: 4.3 10*3/uL (ref 4.0–10.5)

## 2012-06-10 LAB — HEPATIC FUNCTION PANEL
ALT: 71 U/L — ABNORMAL HIGH (ref 0–53)
AST: 84 U/L — ABNORMAL HIGH (ref 0–37)
Albumin: 4.3 g/dL (ref 3.5–5.2)
Total Bilirubin: 1.2 mg/dL (ref 0.3–1.2)

## 2012-06-10 LAB — AFP TUMOR MARKER: AFP-Tumor Marker: 3.9 ng/mL (ref 0.0–8.0)

## 2012-06-10 NOTE — Progress Notes (Signed)
Labs drawn today for liver,afp,cbc,gamma gt

## 2012-06-13 ENCOUNTER — Encounter (HOSPITAL_BASED_OUTPATIENT_CLINIC_OR_DEPARTMENT_OTHER): Payer: BC Managed Care – PPO | Admitting: Oncology

## 2012-06-13 ENCOUNTER — Encounter (HOSPITAL_COMMUNITY): Payer: Self-pay | Admitting: Oncology

## 2012-06-13 VITALS — BP 141/82 | HR 64 | Temp 98.0°F | Resp 16 | Wt 211.0 lb

## 2012-06-13 DIAGNOSIS — R161 Splenomegaly, not elsewhere classified: Secondary | ICD-10-CM

## 2012-06-13 DIAGNOSIS — D696 Thrombocytopenia, unspecified: Secondary | ICD-10-CM

## 2012-06-13 NOTE — Patient Instructions (Addendum)
Jacob Lester  DOB Nov 19, 1965 CSN [id number]  MRN [id number] Dr. Glenford Peers   [facility]  Discharge Instructions  RECOMMENDATIONS MADE BY THE CONSULTANT AND ANY TEST RESULTS WILL BE SENT TO YOUR REFERRING DOCTOR.   EXAM FINDINGS BY MD TODAY AND SIGNS AND SYMPTOMS TO REPORT TO CLINIC OR PRIMARY MD: Some of your blood work has improved some is unchanged.  Need to check some additional labs and get a CT scan of your abdomen and pelvis to check the size of your spleen and your liver.  The low platelet count is probably due to your enlarged spleen.  Continue with weight loss and decreasing your alcohol intake.  MEDICATIONS PRESCRIBED: none   INSTRUCTIONS GIVEN AND DISCUSSED: Other:  Prep for CT scan discussed  SPECIAL INSTRUCTIONS/FOLLOW-UP: Xray Studies Needed:  CT of abdomen and pelvis this week, to see Dr. Karilyn Cota and Return to Clinic in 4 months for follow-up.   I acknowledge that I have been informed and understand all the instructions given to me and received a copy. I do not have any more questions at this time, but understand that I may call the Specialty Clinic at [facility] at [phone] during business hours should I have any further questions or need assistance in obtaining follow-up care.    __________________________________________  _____________  __________ Signature of Patient or Authorized Representative            Date                   Time    __________________________________________ Nurse's Signature

## 2012-06-13 NOTE — Progress Notes (Signed)
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 I have asked him to quit using alcohol. His liver enzymes are slightly better weight is down about 8 pounds but he remains thrombocytopenic. I've discussed his case with Dr. Karilyn Cota and we will get a formal CT of the abdomen and pelvis with contrast since it has been 2 years since a CT scan was performed to rule out diverticulosis at that time. We will also get an HIV test to complete the thrombocytopenia workup. His hepatitis panel was negative. His hemoglobin and white count remain normal. His alpha-fetoprotein was normal.  He therefore needs to see Dr. Karilyn Cota in consultation and we will schedule a CT scan. I've gone over his former CT scan with him which did indeed show fatty infiltration of the liver and splenomegaly at that time. I also went over his ultrasound with Dr. Tyron Russell who clearly feels that the spleen is roughly 18 cm in length. I will see him in 4 months in followup and have encouraged him to again continued to lose weight and to stop using alcohol.

## 2012-06-15 ENCOUNTER — Encounter (INDEPENDENT_AMBULATORY_CARE_PROVIDER_SITE_OTHER): Payer: Self-pay | Admitting: *Deleted

## 2012-06-16 ENCOUNTER — Ambulatory Visit (HOSPITAL_COMMUNITY)
Admission: RE | Admit: 2012-06-16 | Discharge: 2012-06-16 | Disposition: A | Discharge status: Home / Self Care | Payer: BC Managed Care – PPO | Source: Ambulatory Visit | Attending: Oncology | Admitting: Oncology

## 2012-06-16 DIAGNOSIS — R161 Splenomegaly, not elsewhere classified: Secondary | ICD-10-CM | POA: Insufficient documentation

## 2012-06-16 DIAGNOSIS — K766 Portal hypertension: Secondary | ICD-10-CM | POA: Insufficient documentation

## 2012-06-16 DIAGNOSIS — D696 Thrombocytopenia, unspecified: Secondary | ICD-10-CM | POA: Insufficient documentation

## 2012-06-16 DIAGNOSIS — K7460 Unspecified cirrhosis of liver: Secondary | ICD-10-CM | POA: Insufficient documentation

## 2012-06-16 MED ORDER — IOHEXOL 300 MG/ML  SOLN: 100.0000 mL | Freq: Once | INTRAMUSCULAR | Status: AC | PRN

## 2012-06-16 NOTE — Progress Notes (Signed)
Apt has been scheduled for 06/28/12 at 2:15 pm with Dr. Karilyn Cota.

## 2012-06-28 ENCOUNTER — Ambulatory Visit (INDEPENDENT_AMBULATORY_CARE_PROVIDER_SITE_OTHER): Payer: BC Managed Care – PPO | Admitting: Internal Medicine

## 2012-06-28 ENCOUNTER — Encounter (INDEPENDENT_AMBULATORY_CARE_PROVIDER_SITE_OTHER): Payer: Self-pay | Admitting: Internal Medicine

## 2012-06-28 VITALS — BP 150/90 | HR 80 | Temp 97.7°F | Resp 20 | Ht 72.0 in | Wt 206.3 lb

## 2012-06-28 DIAGNOSIS — K219 Gastro-esophageal reflux disease without esophagitis: Secondary | ICD-10-CM | POA: Insufficient documentation

## 2012-06-28 DIAGNOSIS — K7460 Unspecified cirrhosis of liver: Secondary | ICD-10-CM | POA: Insufficient documentation

## 2012-06-28 DIAGNOSIS — D696 Thrombocytopenia, unspecified: Secondary | ICD-10-CM | POA: Insufficient documentation

## 2012-06-28 MED ORDER — LORAZEPAM 0.5 MG PO TABS: 0.5000 mg | ORAL_TABLET | Freq: Three times a day (TID) | ORAL | Status: AC | PRN

## 2012-06-28 NOTE — Progress Notes (Signed)
Reason for consultation;  Further evaluation of new diagnosis of cirrhosis.  History of present illness; Jacob Lester is 46 year old Caucasian male who is referred through courtesy of Dr. Laurie Lester for GI evaluation. Patient was evaluated in emergency room on 02/10/2012 for weight chest symptoms and noted to have platelet count of 80,000. He was therefore referred to Dr. Laurie Lester because of thrombocytopenia. Dr. Laurie Lester noted his transaminases are elevated on its tip to the emergency room. He was therefore further evaluated. His GGT was 361(7-51). Wire markers for A., hep B and C. were negative. Serum B12 and folate levels were normal and ferritin was mildly elevated at 327 ng/ml. Also had upper abdominal ultrasound and 03/09/2012 and noted to have cholelithiasis and an echogenic liver. Repeat blood work on 06/10/2012 was pertinent for platelet count of 83,000, GGT of 310, AST 84 and ALT 70 and serum albumin was 4.3. His alpha-fetoprotein was 3.9. Finally he underwent abdominopelvic CT on 06/16/2012 revealing a Jacob Lester sick appearing liver with portal hypertension, portal venous collaterals and splenomegaly he had small stones in gallbladder. With this information patient with suspected to have chronic liver disease resulting in thrombocytopenia and referred for further evaluation. Patient denies abdominal pain, bowel distention or lower extremity edema. He was advised by Dr. Laurie Lester to quit drinking alcohol. He states he has dropped from 7-8 cans of beer a day to 3. He states his heartburn is well controlled with PPI and he denies dysphagia nausea or vomiting. He has very good appetite, however these changes even habits in an effort to lose weight. He has lost 13 pounds in the last 3 months. The most he's ever weighed was 234 pounds one year ago. He denies history of jaundice or blood transfusion. Family history is negative for chronic liver disease. He also denies epistaxis, occasional bleed, ecchymosis, melena or  rectal bleeding. He has never been vaccinated for hepatitis A or B. He has prescription for low-dose Valium but he is taken at that he occasionally when he is upset or anxious on account of his mother's illness.     Current Medications: Current Outpatient Prescriptions  Medication Sig Dispense Refill  . aspirin EC 81 MG tablet Take 81 mg by mouth daily.      . Flaxseed, Linseed, (FLAXSEED OIL) 1200 MG CAPS Take by mouth daily.      . metoprolol succinate (TOPROL-XL) 100 MG 24 hr tablet Take 100 mg by mouth daily.      . naphazoline (CLEAR EYES) 0.012 % ophthalmic solution Place 1 drop into both eyes daily.      Marland Kitchen olmesartan-hydrochlorothiazide (BENICAR HCT) 40-25 MG per tablet Take 1 tablet by mouth daily.      . Omega-3 Fatty Acids (FISH OIL) 1200 MG CAPS Take 1 capsule by mouth daily.      Marland Kitchen omeprazole (PRILOSEC) 20 MG capsule Take 20 mg by mouth every other day.      . diazepam (VALIUM) 2 MG tablet Take 2 mg by mouth daily.      . hydrochlorothiazide (HYDRODIURIL) 25 MG tablet Take 1 tablet (25 mg total) by mouth daily.  10 tablet  0   Past medical history; Hypertension of 9 years duration. Chronic GERD. History of obesity with peak rate of 234 pounds one year ago. Allergies; NKA. Family history; Mother is in her 48s and been treated for metastatic ovarian carcinoma. Father has been treated for prostate CA and remains in remission. He has 2 brothers in good health. Social history; He is married and  has 3 healthy daughters ages 57, 51 and 17. He quit cigarette smoking in September 2004. 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 He has been drinking alcohol for several years. He was drinking 7-8 cans of beer a day and now down to 3 per day.  Objective: Blood pressure 150/90, pulse 80, temperature 97.7 F (36.5 C), temperature source Oral, resp. rate 20, height 6' (1.829 m), weight 206 lb 4.8 oz (93.577 kg). Patient is alert and in no acute  distress. Asterixis is absent. Conjunctiva is pink. Sclera is nonicteric Oropharyngeal mucosa is normal. No neck masses or thyromegaly noted. Single spider angioma noted over the right shoulder posteriorly. Chest symmetrical without gynecomastia. Cardiac exam with regular rhythm normal S1 and S2. He has faint systolic ejection murmur at LLSB. Lungs are clear to auscultation. Abdomen abdomen is protuberant. Bowel sounds are normal. It is soft on palpation with easily palpable spleen. Liver edge is indistinct. Span is 14-15 cm. Shifting dullness is absent.  No LE edema or clubbing noted.  Labs/studies Results: Lab studies reviewed in history and physical. His WBC has ranged between 4.3 and 5.6. Both ultrasound and abdominopelvic CT films reviewed with the patient.   Assessment:  #1. New diagnosis of cirrhosis. Etiology would appear to be excessive alcohol use over a period of several years. Additional mechanism would be non-alcoholic fatty liver given his obesity there is no way to [place] which blade more significant role. He has splenomegaly and portal venous collaterals in the care of of portal hypertension. At some point he will need to be assessed for esophageal varices and primary prophylaxis instituted if he is found to have esophageal or gastric varices. Elevated transaminases would suggest ongoing hepatocellular injury this should improve alcohol abstinence and continued weight reduction. I do not believe liver biopsy at this point would necessarily help in his management. If transaminases do not return to normal within the next 3 months will check HCV RNA, alpha-1 anti-trypsin and autoimmune markers. Thrombocytopenia out what appeared to be secondary to chronic liver disease and usually indicative of advanced disease; however he has normal serum albumin. I believe his INR would be normal and will be checked at his next blood. #2. GERD. Symptoms well controlled with therapy. #3.  Stress and anxiety secondary to mother's serious illness for which she is using low-dose Valium sporadically. He needs to be switched to lorazepam given underlying liver disease.   Recommendations; He needs to quit drinking alcohol altogether to improve his prognosis. Discontinue Valium. Lorazepam 0.5 mg by mouth 3 times a day when necessary be used both for stress and anxiety and should he develop withdrawal symptoms. He will talk with Dr. Dorthey Sawyer about getting hepatitis A and B. Vaccination. Patient advised to refrain from eating raw fish cause of risk of vibrio vulnificus infection. Patient advised to increase physical activity and should gradually get his way down to about 190 pounds. MVI containing folic acid and thiamine daily. No acetaminophen until transaminases have returned to normal. Will plan EGD after next visit. He would also have INR prior to his next visit. Have alpha-fetoprotein and ultrasound for screening in 6 months. Patient,s  questions were answered and should he have any more he should feel free to call me. Next office visit in 2 months.   Copy to Dr.Eric Neijstrom. Copy to Dr. Dorthey Sawyer.

## 2012-06-28 NOTE — Patient Instructions (Addendum)
Take B complex tablet one daily(folate acid and thiamine). Remember not to eat raw fish. Discontinue Valium. Use lorazepam for anxiety or stress. You must quit drinking alcohol altogether.

## 2012-06-30 ENCOUNTER — Telehealth (INDEPENDENT_AMBULATORY_CARE_PROVIDER_SITE_OTHER): Payer: Self-pay | Admitting: *Deleted

## 2012-06-30 DIAGNOSIS — K760 Fatty (change of) liver, not elsewhere classified: Secondary | ICD-10-CM

## 2012-06-30 NOTE — Telephone Encounter (Signed)
Per Dr.Rehman Mr,Jacob Lester will need to have the following labs prior to his office appointment on November 02/2012. Labs have been noted for November 10-2011. Patient will be sent a letter as a reminder.

## 2012-07-04 ENCOUNTER — Ambulatory Visit (INDEPENDENT_AMBULATORY_CARE_PROVIDER_SITE_OTHER): Payer: BC Managed Care – PPO | Admitting: Internal Medicine

## 2012-08-17 ENCOUNTER — Encounter (INDEPENDENT_AMBULATORY_CARE_PROVIDER_SITE_OTHER): Payer: Self-pay | Admitting: *Deleted

## 2012-08-23 ENCOUNTER — Ambulatory Visit (INDEPENDENT_AMBULATORY_CARE_PROVIDER_SITE_OTHER): Payer: BC Managed Care – PPO | Admitting: Internal Medicine

## 2012-10-17 ENCOUNTER — Ambulatory Visit (HOSPITAL_COMMUNITY): Payer: BC Managed Care – PPO | Admitting: Oncology

## 2012-10-25 ENCOUNTER — Encounter (HOSPITAL_COMMUNITY): Payer: BC Managed Care – PPO | Attending: Oncology | Admitting: Oncology

## 2012-10-25 VITALS — BP 186/99 | HR 71 | Temp 97.9°F | Resp 18 | Wt 211.0 lb

## 2012-10-25 DIAGNOSIS — I10 Essential (primary) hypertension: Secondary | ICD-10-CM | POA: Insufficient documentation

## 2012-10-25 DIAGNOSIS — F411 Generalized anxiety disorder: Secondary | ICD-10-CM

## 2012-10-25 DIAGNOSIS — D696 Thrombocytopenia, unspecified: Secondary | ICD-10-CM | POA: Insufficient documentation

## 2012-10-25 DIAGNOSIS — K7030 Alcoholic cirrhosis of liver without ascites: Secondary | ICD-10-CM | POA: Insufficient documentation

## 2012-10-25 DIAGNOSIS — K7460 Unspecified cirrhosis of liver: Secondary | ICD-10-CM

## 2012-10-25 DIAGNOSIS — K766 Portal hypertension: Secondary | ICD-10-CM | POA: Insufficient documentation

## 2012-10-25 DIAGNOSIS — K219 Gastro-esophageal reflux disease without esophagitis: Secondary | ICD-10-CM | POA: Insufficient documentation

## 2012-10-25 LAB — COMPREHENSIVE METABOLIC PANEL
Albumin: 4.5 g/dL (ref 3.5–5.2)
Alkaline Phosphatase: 70 U/L (ref 39–117)
BUN: 8 mg/dL (ref 6–23)
CO2: 30 mEq/L (ref 19–32)
Chloride: 90 mEq/L — ABNORMAL LOW (ref 96–112)
Creatinine, Ser: 0.67 mg/dL (ref 0.50–1.35)
GFR calc non Af Amer: >90 mL/min (ref >90)
Glucose, Bld: 102 mg/dL — ABNORMAL HIGH (ref 70–99)
Potassium: 3.8 mEq/L (ref 3.5–5.1)
Total Bilirubin: 0.9 mg/dL (ref 0.3–1.2)

## 2012-10-25 LAB — CBC WITH DIFFERENTIAL/PLATELET
Basophils Absolute: 0 10*3/uL (ref 0.0–0.1)
Eosinophils Absolute: 0.1 10*3/uL (ref 0.0–0.7)
Eosinophils Relative: 2 % (ref 0–5)
Lymphocytes Relative: 22 % (ref 12–46)
MCV: 91.8 fL (ref 78.0–100.0)
Neutrophils Relative %: 67 % (ref 43–77)
Platelets: 87 10*3/uL — ABNORMAL LOW (ref 150–400)
RDW: 13 % (ref 11.5–15.5)
WBC: 6.8 10*3/uL (ref 4.0–10.5)

## 2012-10-25 LAB — PROTIME-INR: Prothrombin Time: 15.5 seconds — ABNORMAL HIGH (ref 11.6–15.2)

## 2012-10-25 NOTE — Progress Notes (Signed)
Problem number 1 multifactorial thrombocytopenia but most likely secondary splenomegaly which is due to cirrhosis of the liver, excessive alcohol use is also a likely cause.  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 His height is 6 feet 0 inches in his ideal weight should be 176-180 pounds.  Problem #4 hypertension Problem #5 GERD Problem #6 chronic anxiety and depression which may well be an issue long before his mother became sick with metastatic cancer We had a very long talk today about his anxiety issues. They go back many years. I offered them a consultation with our local psychiatrist if he would like to pursue this. He has lorazepam as needed and I instructed him to use it more at home after work or at bedtime and he does not tolerate well to let myself or Dr. Karilyn Cota know He is not having any bleeding issues but we will continue to monitor his blood counts. Unfortunately he's gained 5 pounds back from his low of 206 pounds when he saw Dr. Karilyn Cota. We will check his blood work today and then I will discuss his case with Dr. Dionicia Abler I will see him in 4 months one way or the other. We discussed his weight is an issue for cirrhosis and his alcohol and I suspect he needs to quit drinking absolutely completely.

## 2012-10-25 NOTE — Progress Notes (Signed)
Patient has office visit next week.  

## 2012-10-25 NOTE — Patient Instructions (Addendum)
.  [facility] Discharge Instructions  RECOMMENDATIONS MADE BY THE CONSULTANT AND ANY TEST RESULTS WILL BE SENT TO YOUR REFERRING PHYSICIAN.  EXAM FINDINGS BY THE PHYSICIAN TODAY AND SIGNS OR SYMPTOMS TO REPORT TO CLINIC OR PRIMARY PHYSICIAN:  Discussed cessation of alcohol completely. Ideal body weight in the 180's   INSTRUCTIONS GIVEN AND DISCUSSED: Labs today  SPECIAL INSTRUCTIONS/FOLLOW-UP: See Neijstrom in 4 months You may want to consider counseling for both anxiety and also nutritional  Thank you for choosing [facility] to provide your oncology and hematology care.  To afford each patient quality time with our providers, please arrive at least 15 minutes before your scheduled appointment time.  With your help, our goal is to use those 15 minutes to complete the necessary work-up to ensure our physicians have the information they need to help with your evaluation and healthcare recommendations.    Effective January 1st, 2014, we ask that you re-schedule your appointment with our physicians should you arrive 10 or more minutes late for your appointment.  We strive to give you quality time with our providers, and arriving late affects you and other patients whose appointments are after yours.    Again, thank you for choosing [facility].  Our hope is that these requests will decrease the amount of time that you wait before being seen by our physicians.       _____________________________________________________________  I acknowledge that I have been informed and understand all the instructions given to me and received a copy. I do not have anymore questions at this time but understand that I may call the Cancer Center at [facility] at [phone] during business hours should I have any further questions or need assistance in obtaining follow-up care.

## 2012-10-26 LAB — GAMMA GT: GGT: 335 U/L — ABNORMAL HIGH (ref 7–51)

## 2012-10-28 ENCOUNTER — Encounter (HOSPITAL_BASED_OUTPATIENT_CLINIC_OR_DEPARTMENT_OTHER): Payer: BC Managed Care – PPO | Admitting: Oncology

## 2012-10-28 DIAGNOSIS — D696 Thrombocytopenia, unspecified: Secondary | ICD-10-CM

## 2012-10-28 DIAGNOSIS — R7989 Other specified abnormal findings of blood chemistry: Secondary | ICD-10-CM

## 2012-10-28 DIAGNOSIS — R791 Abnormal coagulation profile: Secondary | ICD-10-CM

## 2012-10-28 DIAGNOSIS — K7030 Alcoholic cirrhosis of liver without ascites: Secondary | ICD-10-CM

## 2012-10-28 NOTE — Patient Instructions (Addendum)
[facility] Discharge Instructions  RECOMMENDATIONS MADE BY THE CONSULTANT AND ANY TEST RESULTS WILL BE SENT TO YOUR REFERRING PHYSICIAN.  EXAM FINDINGS BY THE PHYSICIAN TODAY AND SIGNS OR SYMPTOMS TO REPORT TO CLINIC OR PRIMARY PHYSICIAN: Discussion by MD.  Your liver functions tests are still elevated and your coagulation studies are abnormal (prothrombin time).  You need to control your weight and you need to stop drinking altogether to protect your liver.   You need to follow-up with Dr. Karilyn Cota for the EGD.  MEDICATIONS PRESCRIBED:  none  SPECIAL INSTRUCTIONS/FOLLOW-UP: Follow-up with Dr. Karilyn Cota and to see Dr. Mariel Sleet in May.  Thank you for choosing [facility] to provide your oncology and hematology care.  To afford each patient quality time with our providers, please arrive at least 15 minutes before your scheduled appointment time.  With your help, our goal is to use those 15 minutes to complete the necessary work-up to ensure our physicians have the information they need to help with your evaluation and healthcare recommendations.    Effective January 1st, 2014, we ask that you re-schedule your appointment with our physicians should you arrive 10 or more minutes late for your appointment.  We strive to give you quality time with our providers, and arriving late affects you and other patients whose appointments are after yours.    Again, thank you for choosing [facility].  Our hope is that these requests will decrease the amount of time that you wait before being seen by our physicians.       _____________________________________________________________  Should you have questions after your visit to [facility], please contact our office at [phone] between the hours of 8:30 a.m. and 5:00 p.m.  Voicemails left after 4:30 p.m. will not be returned until the following business day.  For prescription refill requests, have  your pharmacy contact our office with your prescription refill request.

## 2012-10-28 NOTE — Progress Notes (Signed)
The patient clearly has advanced liver disease with elevation in multiple liver enzymes and an abnormal pt/INR. His liver enzymes have truly not improved and may be slightly worse when taking into account his INR. He clearly has issues with alcohol and weight but after discussing his case with Dr. Karilyn Cota we both feel that his primary area of disease centers around his alcohol use. I've discussed these issues with him and his wife today and have encouraged him to stop alcohol use on condition only and if he needs help there are resources available. I think his prognosis going forward absolutely depends on whether he quit drinking or not. Dr. Karilyn Cota feels that if he is not quit drinking altogether he may not make a past his 50th birthday. The patient will be seen by Dr. Karilyn Cota Monday for endoscopy, consultation, etc.

## 2012-10-31 ENCOUNTER — Encounter (INDEPENDENT_AMBULATORY_CARE_PROVIDER_SITE_OTHER): Payer: Self-pay | Admitting: Internal Medicine

## 2012-10-31 ENCOUNTER — Ambulatory Visit (INDEPENDENT_AMBULATORY_CARE_PROVIDER_SITE_OTHER): Payer: BC Managed Care – PPO | Admitting: Internal Medicine

## 2012-10-31 VITALS — BP 130/90 | HR 80 | Temp 97.6°F | Resp 20 | Ht 72.0 in | Wt 209.4 lb

## 2012-10-31 DIAGNOSIS — F411 Generalized anxiety disorder: Secondary | ICD-10-CM

## 2012-10-31 DIAGNOSIS — F419 Anxiety disorder, unspecified: Secondary | ICD-10-CM

## 2012-10-31 DIAGNOSIS — D696 Thrombocytopenia, unspecified: Secondary | ICD-10-CM

## 2012-10-31 DIAGNOSIS — K7460 Unspecified cirrhosis of liver: Secondary | ICD-10-CM

## 2012-10-31 MED ORDER — LORAZEPAM 0.5 MG PO TABS: 0.5000 mg | ORAL_TABLET | Freq: Three times a day (TID) | ORAL | Status: AC | PRN

## 2012-10-31 MED ORDER — LORAZEPAM 0.5 MG PO TABS: 0.5000 mg | ORAL_TABLET | Freq: Three times a day (TID) | ORAL | Status: DC

## 2012-10-31 NOTE — Patient Instructions (Signed)
Take lorazepam 0.5 mg by mouth up to 3 times a day. Next blood work to be done in 2 months.

## 2012-10-31 NOTE — Progress Notes (Signed)
Presenting complaint;  Follow for alcoholic cirrhosis.  Subjective:  Patient is 47 year old Caucasian male who is here for scheduled visit. He was initially seen on 06/28/2012 for new diagnosis of cirrhosis felt to be secondary to excessive use of alcohol. He was given prescription for lorazepam. However he has continued to drink alcohol until 3 days ago. He was seen by Dr. Laurie Panda last week and had blood work. He was informed in clear terms that if he does not quit drinking alcohol his prognosis is very poor. He states he hasn't passed alcohol since he saw Dr. Laurie Panda 4 days ago. He denies abdominal pain, lower extremity edema, nausea, vomiting, Lena or rectal bleeding. His weight has been stable since his last visit. He also denies easy bruisability or gum bleeding.   Current Medications: Current Outpatient Prescriptions  Medication Sig Dispense Refill  . aspirin EC 81 MG tablet Take 81 mg by mouth daily.      . Flaxseed, Linseed, (FLAXSEED OIL) 1200 MG CAPS Take by mouth daily.      . metoprolol succinate (TOPROL-XL) 100 MG 24 hr tablet Take 100 mg by mouth daily.      . naphazoline (CLEAR EYES) 0.012 % ophthalmic solution Place 1 drop into both eyes daily.      Marland Kitchen olmesartan-hydrochlorothiazide (BENICAR HCT) 40-25 MG per tablet Take 1 tablet by mouth daily.      . Omega-3 Fatty Acids (FISH OIL) 1200 MG CAPS Take 1 capsule by mouth daily.      Marland Kitchen omeprazole (PRILOSEC) 20 MG capsule Take 20 mg by mouth every other day.         Objective: Blood pressure 130/90, pulse 80, temperature 97.6 F (36.4 C), temperature source Oral, resp. rate 20, height 6' (1.829 m), weight 209 lb 6.4 oz (94.983 kg). Patient is alert, anxious but in no acute distress. Asterixis absent. Conjunctiva is pink. Sclera is nonicteric Oropharyngeal mucosa is normal. No neck masses or thyromegaly noted. Cardiac exam with regular rhythm normal S1 and S2. No murmur or gallop noted. Lungs are clear to  auscultation. Abdomen is full and soft with easily palpable spleen. Liver edge is indistinct and span is felt to be 14 cm. Shifting dullness absent.  No LE edema or clubbing noted.  Labs/studies Results: Lab data from 10/25/2012. INR 1.25. Serum sodium 130, potassium 3.8, right 90, CO2 30, glucose 102, BUN 8, creatinine 0.67. Bilirubin 0.9, AP 70, AST 103, ALT 69, total protein 8.2, albumin 4.5 and calcium 9.7. WBC 6.8, H&H 15.1 and 42.8 and platelet count 87K MELD score is 9    Assessment:  Alcoholic cirrhosis appears to be well compensated. Meld score is 9. If he continues to drink alcohol he will develop decompensation with its sequelae such as ascites hepatic encephalopathy and varices. He may also have an element of fatty liver and needs to exercise regularly and gradually loses weight. He appears to be determined feels he does not need to go to rehabilitation center. He is going to [place] next week for vacation this would be a big test for him and hope he can control himself. He can take lorazepam for anxiety or withdrawal symptoms. If he goes back to drinking again he should consider referable to rehabilitation center. Will plan EGD later this year looking for ways disease.   Plan:  New prescription given for lorazepam 0.5 mg by mouth 3 times a day 60 with 1 refill. Will do CBC and LFTs in 2 months. Office visit in 4 months.

## 2012-12-15 ENCOUNTER — Encounter (INDEPENDENT_AMBULATORY_CARE_PROVIDER_SITE_OTHER): Payer: Self-pay | Admitting: *Deleted

## 2012-12-15 ENCOUNTER — Other Ambulatory Visit (INDEPENDENT_AMBULATORY_CARE_PROVIDER_SITE_OTHER): Payer: Self-pay | Admitting: *Deleted

## 2012-12-15 DIAGNOSIS — K7460 Unspecified cirrhosis of liver: Secondary | ICD-10-CM

## 2012-12-15 DIAGNOSIS — D696 Thrombocytopenia, unspecified: Secondary | ICD-10-CM

## 2012-12-29 LAB — CBC
MCH: 32.4 pg (ref 26.0–34.0)
MCV: 88.2 fL (ref 78.0–100.0)
Platelets: 73 10*3/uL — ABNORMAL LOW (ref 150–400)
RDW: 13.9 % (ref 11.5–15.5)
WBC: 6 10*3/uL (ref 4.0–10.5)

## 2012-12-30 LAB — HEPATIC FUNCTION PANEL
Albumin: 4.2 g/dL (ref 3.5–5.2)
Total Protein: 7.2 g/dL (ref 6.0–8.3)

## 2013-02-22 ENCOUNTER — Ambulatory Visit (HOSPITAL_COMMUNITY): Payer: BC Managed Care – PPO | Admitting: Oncology

## 2013-03-07 ENCOUNTER — Ambulatory Visit (INDEPENDENT_AMBULATORY_CARE_PROVIDER_SITE_OTHER): Payer: BC Managed Care – PPO | Admitting: Internal Medicine

## 2013-03-07 ENCOUNTER — Encounter (INDEPENDENT_AMBULATORY_CARE_PROVIDER_SITE_OTHER): Payer: Self-pay | Admitting: Internal Medicine

## 2013-03-07 VITALS — BP 140/100 | HR 80 | Temp 97.1°F | Resp 20 | Ht 72.0 in | Wt 211.1 lb

## 2013-03-07 DIAGNOSIS — K7460 Unspecified cirrhosis of liver: Secondary | ICD-10-CM

## 2013-03-07 DIAGNOSIS — D696 Thrombocytopenia, unspecified: Secondary | ICD-10-CM

## 2013-03-07 NOTE — Progress Notes (Signed)
Presenting complaint;  Followup for alcoholic cirrhosis.  Subjective:  Jacob Lester is 47 year old Caucasian male who has alcoholic cirrhosis and was last seen 4 months ago. He feels much better. He has not had any alcohol since his last visit of 10/31/2012. He has very good appetite. He gained few pounds body was in vacation. He states his heartburn is well controlled with every other day omeprazole. He denies nausea vomiting abdominal pain melena or rectal bleeding. He is also not having any problems with LE edema.   Current Medications: Current Outpatient Prescriptions  Medication Sig Dispense Refill  . aspirin EC 81 MG tablet Take 81 mg by mouth daily.      . Flaxseed, Linseed, (FLAXSEED OIL) 1200 MG CAPS Take by mouth daily.      . metoprolol succinate (TOPROL-XL) 100 MG 24 hr tablet Take 100 mg by mouth daily.      . naphazoline (CLEAR EYES) 0.012 % ophthalmic solution Place 1 drop into both eyes daily.      Marland Kitchen olmesartan-hydrochlorothiazide (BENICAR HCT) 40-25 MG per tablet Take 1 tablet by mouth daily.      . Omega-3 Fatty Acids (FISH OIL) 1200 MG CAPS Take 1 capsule by mouth daily.      Marland Kitchen omeprazole (PRILOSEC) 20 MG capsule Take 20 mg by mouth every other day.      Marland Kitchen LORazepam (ATIVAN) 0.5 MG tablet Take 1 tablet (0.5 mg total) by mouth every 8 (eight) hours as needed for anxiety.  60 tablet  1   No current facility-administered medications for this visit.     Objective: Blood pressure 140/100, pulse 80, temperature 97.1 F (36.2 C), temperature source Oral, resp. rate 20, height 6' (1.829 m), weight 211 lb 1.6 oz (95.754 kg). Patient is alert and does not have asterixis. Conjunctiva is pink. Sclera is nonicteric Oropharyngeal mucosa is normal. No neck masses or thyromegaly noted. Cardiac exam with regular rhythm normal S1 and S2. No murmur or gallop noted. Lungs are clear to auscultation. Abdomen is full but soft and nontender with easily palpable spleen firm liver edge with  prominent left lobe. Shifting dullness is absent.  No LE edema or clubbing noted.  Labs/studies Results: CBC from 12/15/2012. WBC 6.0, H&H 15.6 and 42.5. Platelet count 73K. LFTs from 12/29/2012. Bilirubin 1.1, AP 54, AST 64, ALT 42 and albumin 4.2.     Assessment:  #1. Alcoholic cirrhosis with preserved hepatic function. LFTs 2 months ago were normal except AST was 64. Expect that this would return to normal now that he's not drinking alcohol. He will need to be screened for esophageal varices at some point. #2. Thrombocytopenia secondary to cirrhosis and splenomegaly.    Plan:  Patient is fully aware that he must not drink alcohol in order to prevent further hepatic injury which would lead to hepatic decompensation and other sequelae of cirrhosis. Patient will have CBC, comprehensive chemistry panel and AFP in July 2014. Hepatic ultrasound in July 2014. EGD following his blood work and ultrasound in July 2014.  Office visit in 6 months.

## 2013-03-07 NOTE — Patient Instructions (Signed)
Please remember to exercise and walk 3-4 times a week and for 30 minutes each time as discussed. Next blood work to be done in middle of July 2014. Will plan EGD in July or August 2014

## 2013-03-15 ENCOUNTER — Ambulatory Visit (HOSPITAL_COMMUNITY): Payer: BC Managed Care – PPO | Admitting: Oncology

## 2013-03-21 ENCOUNTER — Encounter (HOSPITAL_COMMUNITY): Payer: Self-pay

## 2013-04-11 ENCOUNTER — Encounter (INDEPENDENT_AMBULATORY_CARE_PROVIDER_SITE_OTHER): Payer: Self-pay | Admitting: *Deleted

## 2013-04-12 ENCOUNTER — Other Ambulatory Visit (INDEPENDENT_AMBULATORY_CARE_PROVIDER_SITE_OTHER): Payer: Self-pay | Admitting: *Deleted

## 2013-04-12 ENCOUNTER — Encounter (INDEPENDENT_AMBULATORY_CARE_PROVIDER_SITE_OTHER): Payer: Self-pay | Admitting: *Deleted

## 2013-04-12 DIAGNOSIS — K7460 Unspecified cirrhosis of liver: Secondary | ICD-10-CM

## 2013-04-12 DIAGNOSIS — D696 Thrombocytopenia, unspecified: Secondary | ICD-10-CM

## 2013-04-24 IMAGING — CT CT ABD-PEL WO/W CM
3 of 9 series · 12 of 46 positions shown, 18 images · IV contrast (Omnipaque 300)
Comparison: CT scan 08/22/2010.

CLINICAL DATA: Cirrhosis.

CT ABDOMEN AND PELVIS WITHOUT AND WITH CONTRAST
TECHNIQUE: Multidetector CT imaging of the abdomen and pelvis was
performed without contrast material in one or both body regions,
followed by contrast material(s) and further sections in one or
both body regions.
Contrast:  100 ml Bmnipaque-C99.

[Series 3: arterial 25 sec 3.0 b40f · axial · arterial · 0.78mm/px · z∈[-200,-128]mm · 2 of 97 slices shown]
[im 25/97  soft-tissue]
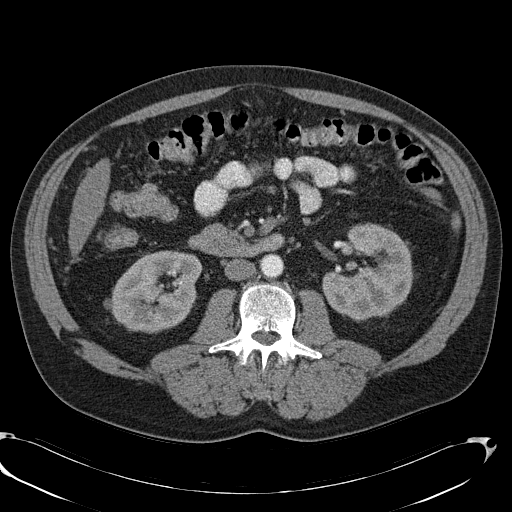
[im 49/97  soft-tissue]
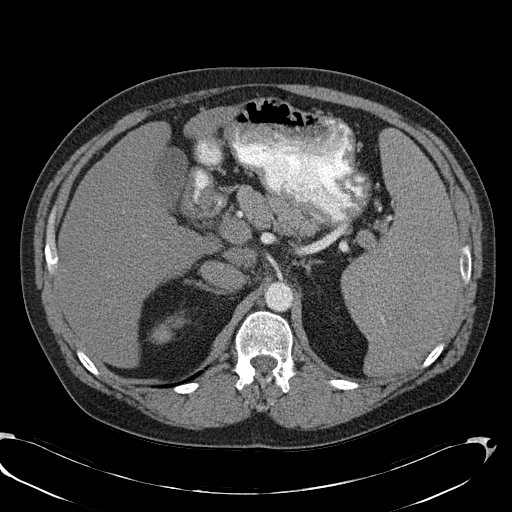

[Series 4: mpr arterial cor (id) · coronal · arterial · 0.66mm/px · 3 of 101 slices shown, 4 images]
[im 26/101  soft-tissue]
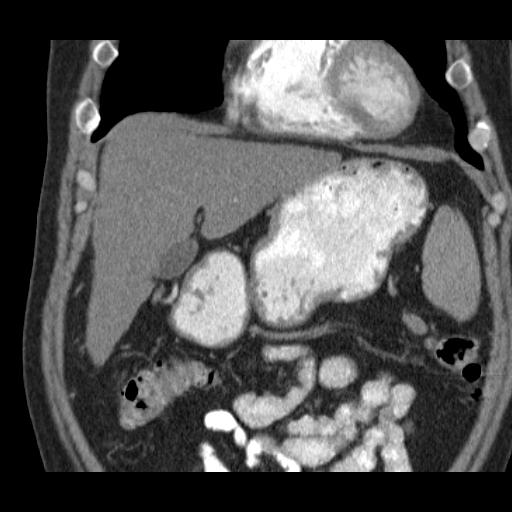
[im 51/101  soft-tissue]
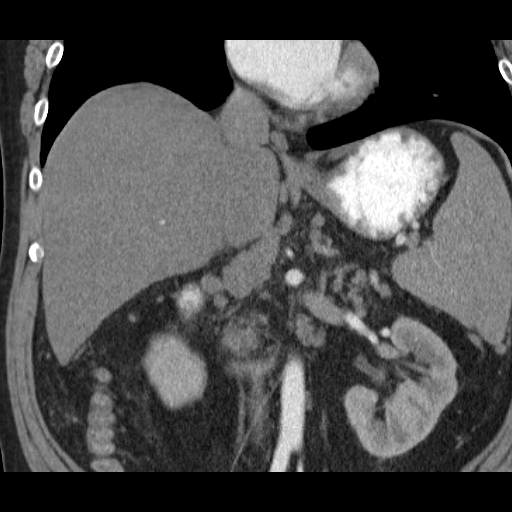
[im 51/101  bone]
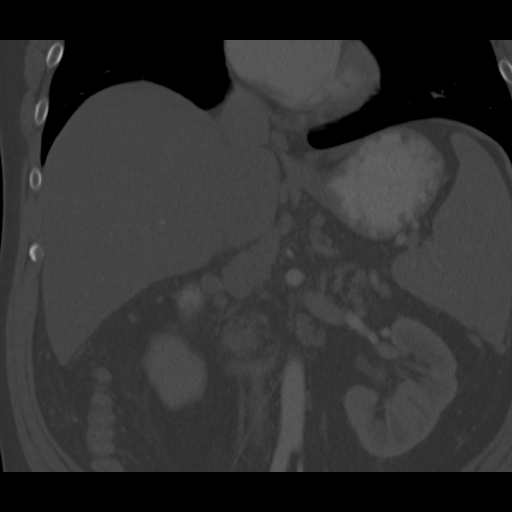
[im 76/101  soft-tissue]
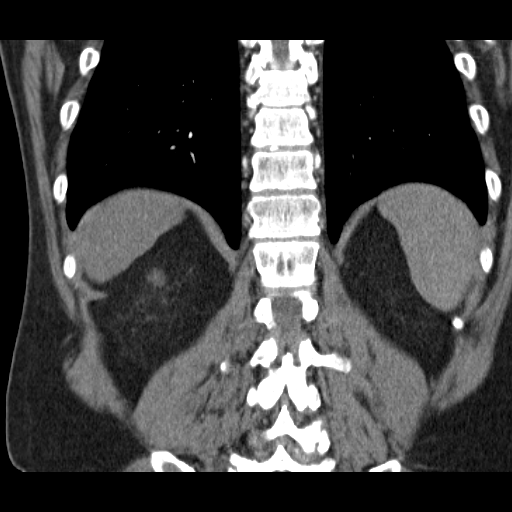

[Series 7: venous 60 sec 3.0 b40f · axial · portal-venous · 0.77mm/px · z∈[-428,-50]mm · 7 of 170 slices shown, 12 images]
[im 22/170  soft-tissue]
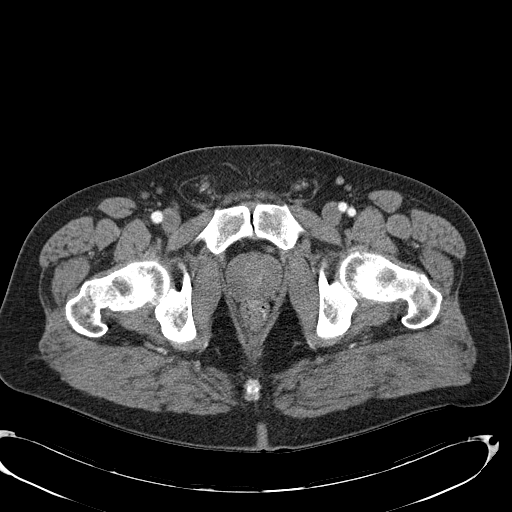
[im 22/170  bone]
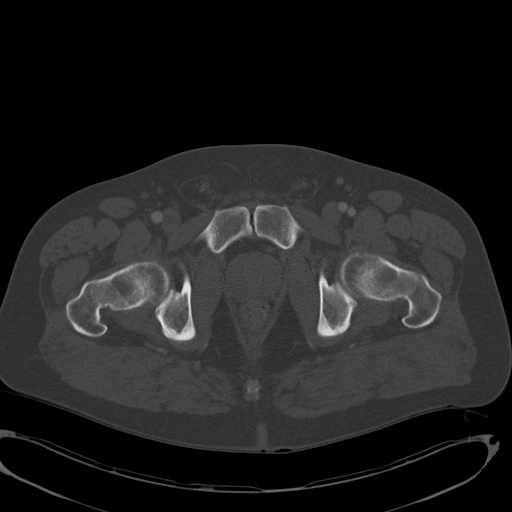
[im 43/170  soft-tissue]
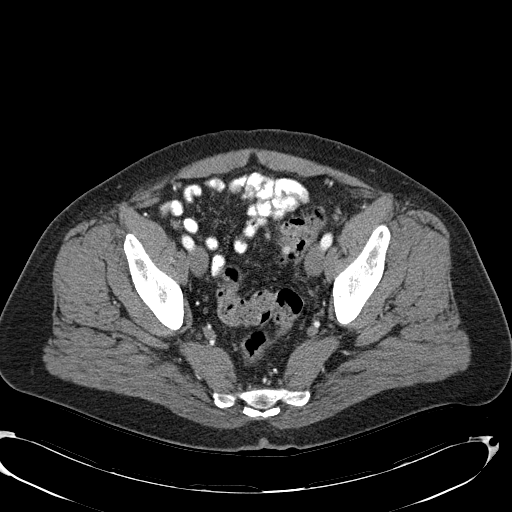
[im 64/170  soft-tissue]
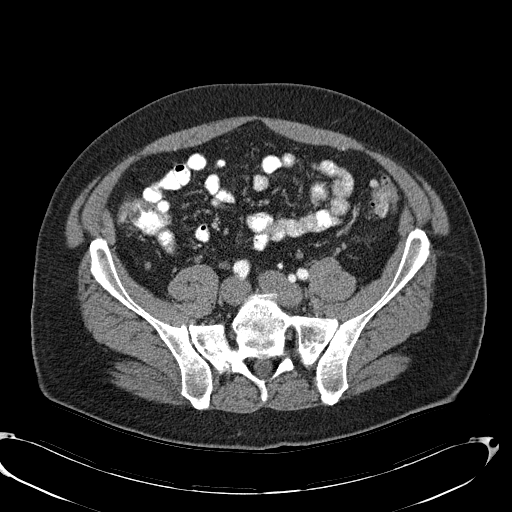
[im 85/170  soft-tissue]
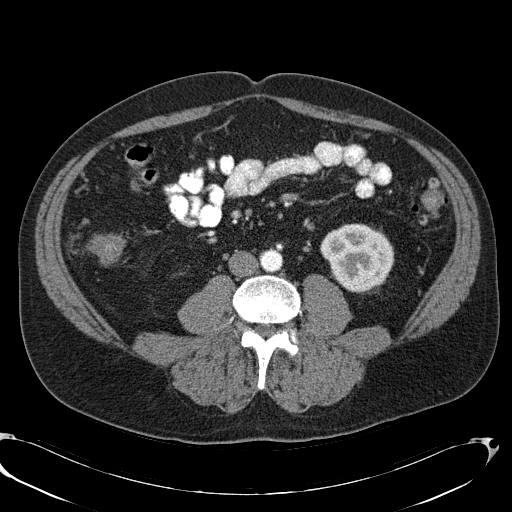
[im 85/170  lung]
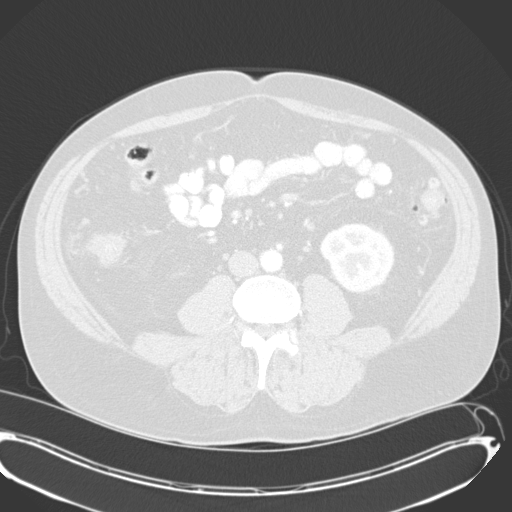
[im 106/170  soft-tissue]
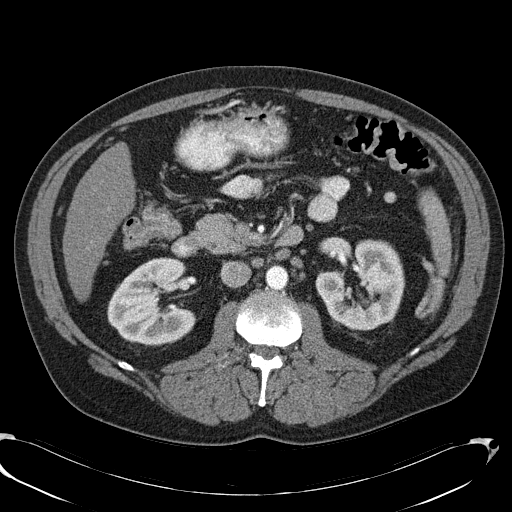
[im 106/170  lung]
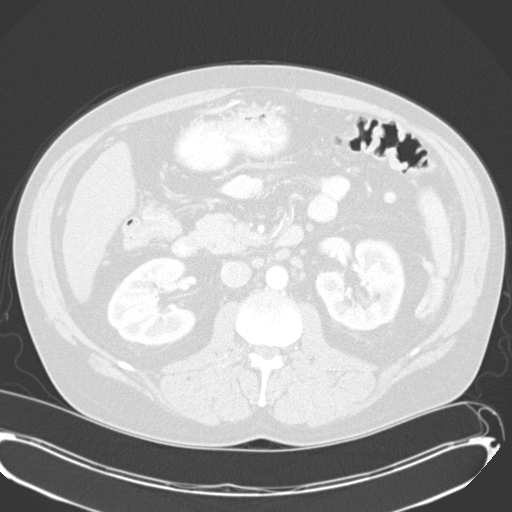
[im 127/170  soft-tissue]
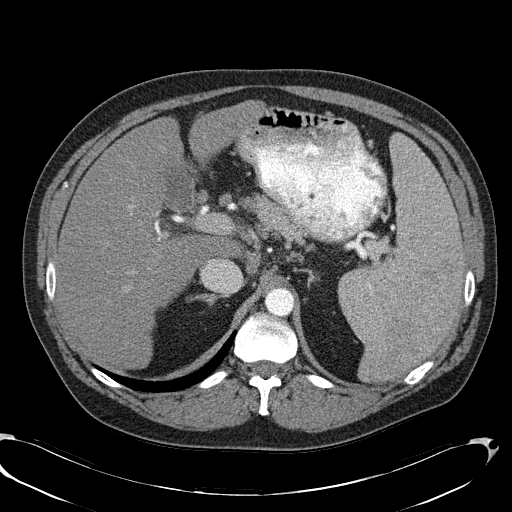
[im 127/170  lung]
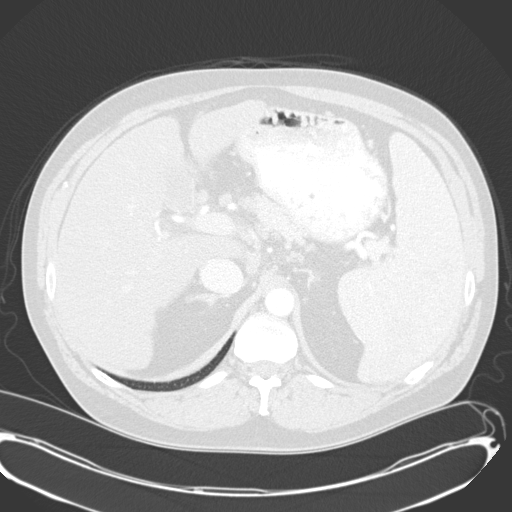
[im 148/170  soft-tissue]
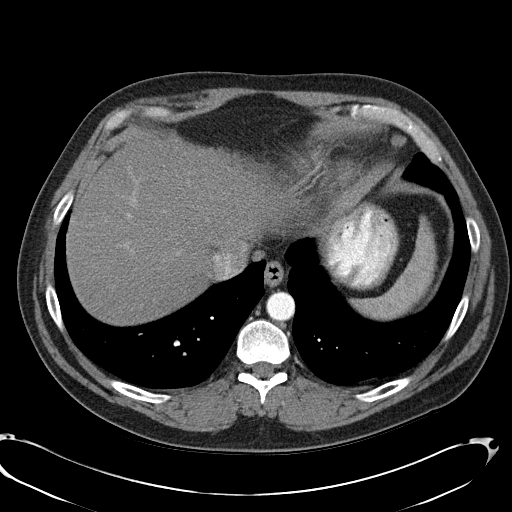
[im 148/170  lung]
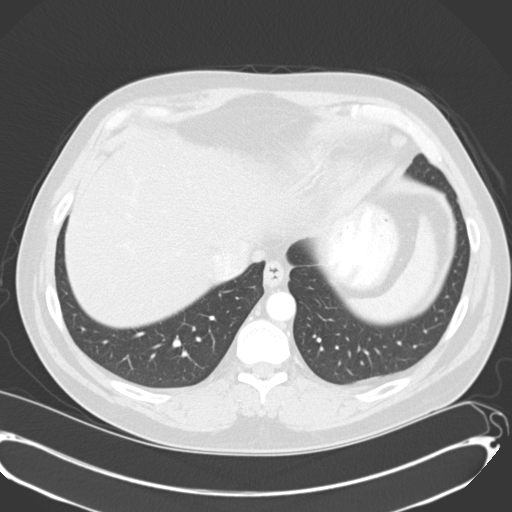

[12 of 46 positions shown; findings below may reference images not displayed]

FINDINGS: The lung bases are clear.  No pleural effusion or
pulmonary nodule.  The heart is normal in size.  No pericardial
effusion.

There are cirrhotic changes involving the liver with a markedly
irregular contour or, fibrosis and increased caudate to right lobe
ratio.  There is also associated portal venous hypertension with
portal venous collaterals and significant splenomegaly.  The spleen
measures 19 x 14 x 9 cm.  There are enlarged periportal and celiac
axis lymph nodes likely due to cirrhosis.  No worrisome enhancing
hepatic lesions to suggest dysplastic nodules or hepatocellular
cancer.  The portal and splenic veins are patent.  The hepatic
veins are patent.  There is diffuse fatty infiltration of the
liver.

The pancreas is normal.  The adrenal glands and kidneys are normal.
No common bile duct dilatation.  The gallbladder is normal.  The
aorta is normal in caliber.  The major branch vessels are normal.
There are numerous small scattered retroperitoneal lymph nodes.

The stomach, duodenum, small bowel and colon are unremarkable.
There is moderate scattered colonic diverticulosis. The appendix is
normal.  The bladder, prostate gland and seminal vesicles are
unremarkable.  No pelvic mass or adenopathy.  There are small
inguinal hernias containing fat.

The bony structures are unremarkable.  Moderate facet degenerative
changes noted in the lower lumbar spine.
IMPRESSION: 1.  Cirrhosis with portal hypertension, portal venous collaterals
and splenomegaly.
2.  Stable upper abdominal lymphadenopathy.
3.  No worrisome hepatic lesions.

## 2013-05-03 LAB — CBC
Hemoglobin: 14.7 g/dL (ref 13.0–17.0)
RBC: 4.51 MIL/uL (ref 4.22–5.81)

## 2013-05-04 LAB — AFP TUMOR MARKER: AFP-Tumor Marker: 5.8 ng/mL (ref 0.0–8.0)

## 2013-05-04 LAB — COMPREHENSIVE METABOLIC PANEL
ALT: 45 U/L (ref 0–53)
CO2: 30 mEq/L (ref 19–32)
Calcium: 9 mg/dL (ref 8.4–10.5)
Chloride: 92 mEq/L — ABNORMAL LOW (ref 96–112)
Potassium: 3.7 mEq/L (ref 3.5–5.3)
Sodium: 131 mEq/L — ABNORMAL LOW (ref 135–145)
Total Protein: 6.6 g/dL (ref 6.0–8.3)

## 2013-05-06 ENCOUNTER — Telehealth (INDEPENDENT_AMBULATORY_CARE_PROVIDER_SITE_OTHER): Payer: Self-pay | Admitting: Internal Medicine

## 2013-05-08 ENCOUNTER — Telehealth (INDEPENDENT_AMBULATORY_CARE_PROVIDER_SITE_OTHER): Payer: Self-pay | Admitting: *Deleted

## 2013-05-08 DIAGNOSIS — K7030 Alcoholic cirrhosis of liver without ascites: Secondary | ICD-10-CM

## 2013-05-08 DIAGNOSIS — D696 Thrombocytopenia, unspecified: Secondary | ICD-10-CM

## 2013-05-08 NOTE — Telephone Encounter (Signed)
Patient and message/results left on his asvc.

## 2013-05-08 NOTE — Telephone Encounter (Signed)
Per Dr.Rehman the patient will need to have labs drawn in 3 months 

## 2013-07-19 ENCOUNTER — Other Ambulatory Visit (INDEPENDENT_AMBULATORY_CARE_PROVIDER_SITE_OTHER): Payer: Self-pay | Admitting: *Deleted

## 2013-07-19 ENCOUNTER — Encounter (INDEPENDENT_AMBULATORY_CARE_PROVIDER_SITE_OTHER): Payer: Self-pay | Admitting: *Deleted

## 2013-07-19 DIAGNOSIS — K7030 Alcoholic cirrhosis of liver without ascites: Secondary | ICD-10-CM

## 2013-07-19 DIAGNOSIS — D696 Thrombocytopenia, unspecified: Secondary | ICD-10-CM

## 2013-09-02 LAB — HEPATIC FUNCTION PANEL
AST: 78 U/L — ABNORMAL HIGH (ref 0–37)
Albumin: 4.3 g/dL (ref 3.5–5.2)
Total Bilirubin: 1.5 mg/dL — ABNORMAL HIGH (ref 0.3–1.2)

## 2013-09-05 ENCOUNTER — Ambulatory Visit (INDEPENDENT_AMBULATORY_CARE_PROVIDER_SITE_OTHER): Payer: BC Managed Care – PPO | Admitting: Internal Medicine

## 2013-09-05 ENCOUNTER — Encounter (INDEPENDENT_AMBULATORY_CARE_PROVIDER_SITE_OTHER): Payer: Self-pay | Admitting: Internal Medicine

## 2013-09-05 VITALS — BP 140/90 | HR 80 | Temp 98.3°F | Resp 18 | Ht 72.0 in | Wt 216.7 lb

## 2013-09-05 DIAGNOSIS — R7401 Elevation of levels of liver transaminase levels: Secondary | ICD-10-CM

## 2013-09-05 DIAGNOSIS — K7030 Alcoholic cirrhosis of liver without ascites: Secondary | ICD-10-CM

## 2013-09-05 NOTE — Progress Notes (Signed)
Presenting complaint;  Followup for alcoholic liver disease.  Subjective:  Patient is 47 year old Caucasian male who has history of alcoholic cirrhosis and elevated transaminases felt to be secondary to ongoing alcoholic hepatitis he was last seen 6 months ago and now presents for scheduled visit. He states he hasn't had any alcohol in the last 4 months. He has not been exercising as recommended. He states he is under a lot of stress relating to his work. He states his heartburn is well controlled with omeprazole which she takes every other day. About 3 weeks ago he was treated for sigmoid diverticulitis by Dr. Regino Schultze. He took Cipro and Flagyl with resolution of his pain. He states he is been treated 3-44 times altogether. He's never been hospitalized. He remains with very good appetite. He denies nausea vomiting abdominal pain melena or rectal bleeding. He did not undergo ultrasound in July 2014 as recommended. He also has not undergone EGD looking for varices.  Current Medications: Current Outpatient Prescriptions  Medication Sig Dispense Refill  . aspirin EC 81 MG tablet Take 81 mg by mouth daily.      . Flaxseed, Linseed, (FLAXSEED OIL) 1200 MG CAPS Take by mouth daily.      Marland Kitchen losartan-hydrochlorothiazide (HYZAAR) 100-25 MG per tablet Take 1 tablet by mouth daily.       . metoprolol succinate (TOPROL-XL) 100 MG 24 hr tablet Take 100 mg by mouth daily.      . naphazoline (CLEAR EYES) 0.012 % ophthalmic solution Place 1 drop into both eyes daily.      . Omega-3 Fatty Acids (FISH OIL) 1200 MG CAPS Take 1 capsule by mouth daily.      Marland Kitchen omeprazole (PRILOSEC) 20 MG capsule Take 20 mg by mouth every other day.      Marland Kitchen LORazepam (ATIVAN) 0.5 MG tablet Take 1 tablet (0.5 mg total) by mouth every 8 (eight) hours as needed for anxiety.  60 tablet  1   No current facility-administered medications for this visit.     Objective: Blood pressure 140/90, pulse 80, temperature 98.3 F (36.8 C),  temperature source Oral, resp. rate 18, height 6' (1.829 m), weight 216 lb 11.2 oz (98.294 kg). Patient is alert and in no acute distress. He does not have asterixis Conjunctiva is pink. Sclera is nonicteric Oropharyngeal mucosa is normal. No neck masses or thyromegaly noted. Cardiac exam with regular rhythm normal S1 and S2. No murmur or gallop noted. Lungs are clear to auscultation. Abdomen it's symmetrical and soft with easily palpable spleen. Liver edge is firm but nontender.  No LE edema or clubbing noted.  Labs/studies Results: LFTs from 09/01/2013. Total bilirubin 1.5 and direct 0.5, AB 63, AST 78, ALT 55 and albumin 4.3.      Assessment:  #1. Alcoholic liver disease. Patient has both alcoholic cirrhosis and alcoholic hepatitis. His transaminases actually have gone up since they were last checked in July 2014. He could have an element of non-alcoholic steatohepatitis am concerned that he may still be drinking alcohol. In order to improve his prognosis he must not drink alcohol and he needs to start exercising so that he can lose some weight. #2. Thrombocytopenia secondary to cirrhosis. .   Plan:  Upper abdominal ultrasound. Schedule esophagogastroduodenoscopy when patient is ready. He would undergo CBC, comprehensive chemistry panel and AFP in January 2015. Office visit in 6 months.

## 2013-09-05 NOTE — Patient Instructions (Signed)
Physician will contact you with results of ultrasound when completed. Next blood work would be in January 2015

## 2013-09-12 ENCOUNTER — Ambulatory Visit (HOSPITAL_COMMUNITY): Payer: BC Managed Care – PPO | Attending: Internal Medicine

## 2013-09-20 ENCOUNTER — Ambulatory Visit (HOSPITAL_COMMUNITY)
Admission: RE | Admit: 2013-09-20 | Discharge: 2013-09-20 | Disposition: A | Discharge status: Home / Self Care | Payer: BC Managed Care – PPO | Source: Ambulatory Visit | Attending: Internal Medicine | Admitting: Internal Medicine

## 2013-09-20 DIAGNOSIS — R7989 Other specified abnormal findings of blood chemistry: Secondary | ICD-10-CM | POA: Insufficient documentation

## 2013-09-20 DIAGNOSIS — K7030 Alcoholic cirrhosis of liver without ascites: Secondary | ICD-10-CM

## 2013-09-20 DIAGNOSIS — R7401 Elevation of levels of liver transaminase levels: Secondary | ICD-10-CM

## 2013-09-20 DIAGNOSIS — K7460 Unspecified cirrhosis of liver: Secondary | ICD-10-CM | POA: Insufficient documentation

## 2013-09-20 DIAGNOSIS — K8020 Calculus of gallbladder without cholecystitis without obstruction: Secondary | ICD-10-CM | POA: Insufficient documentation

## 2013-09-28 ENCOUNTER — Encounter (INDEPENDENT_AMBULATORY_CARE_PROVIDER_SITE_OTHER): Payer: Self-pay | Admitting: *Deleted

## 2013-09-28 ENCOUNTER — Other Ambulatory Visit (INDEPENDENT_AMBULATORY_CARE_PROVIDER_SITE_OTHER): Payer: Self-pay | Admitting: *Deleted

## 2013-09-28 DIAGNOSIS — K7030 Alcoholic cirrhosis of liver without ascites: Secondary | ICD-10-CM

## 2013-09-28 DIAGNOSIS — R7401 Elevation of levels of liver transaminase levels: Secondary | ICD-10-CM

## 2014-01-10 ENCOUNTER — Encounter (INDEPENDENT_AMBULATORY_CARE_PROVIDER_SITE_OTHER): Payer: Self-pay | Admitting: *Deleted

## 2014-02-23 ENCOUNTER — Encounter (INDEPENDENT_AMBULATORY_CARE_PROVIDER_SITE_OTHER): Payer: Self-pay | Admitting: *Deleted

## 2014-06-05 ENCOUNTER — Ambulatory Visit (INDEPENDENT_AMBULATORY_CARE_PROVIDER_SITE_OTHER): Payer: BC Managed Care – PPO | Admitting: Internal Medicine

## 2014-06-26 ENCOUNTER — Telehealth (INDEPENDENT_AMBULATORY_CARE_PROVIDER_SITE_OTHER): Payer: Self-pay | Admitting: *Deleted

## 2014-06-26 ENCOUNTER — Ambulatory Visit (INDEPENDENT_AMBULATORY_CARE_PROVIDER_SITE_OTHER): Payer: BC Managed Care – PPO | Admitting: Internal Medicine

## 2014-06-26 NOTE — Telephone Encounter (Signed)
Left message this morning at 7:35  Out of town will need to reschedule

## 2014-07-04 ENCOUNTER — Encounter (INDEPENDENT_AMBULATORY_CARE_PROVIDER_SITE_OTHER): Payer: Self-pay | Admitting: *Deleted

## 2014-07-04 ENCOUNTER — Telehealth (INDEPENDENT_AMBULATORY_CARE_PROVIDER_SITE_OTHER): Payer: Self-pay | Admitting: *Deleted

## 2014-07-04 NOTE — Telephone Encounter (Signed)
Jacob Lester No Showed for his apt on 06/26/14 with Dr. Laural Golden. A NS letter has been mailed.

## 2016-06-25 NOTE — H&P (Signed)
  NTS SOAP Note  Vital Signs:  Vitals as of: [id number]: Systolic [id number]: Diastolic 89: Heart Rate 63: Temp 98.1F (Temporal): Height 56ft 0in: Weight 230Lbs 0 Ounces: BMI 31.19   BMI : 31.19 kg/m2  Subjective: This 50 year old male presents for of need for screening TCS.  Never has had a colonoscopy.  Denies any lower gi complaints.  No family h/o colon cancer.  No weight loss, blood in stools, abdominal pain.  Review of Symptoms:  Constitutional:negative Head:negative Eyes:negative Nose/Mouth/Throat:negative Cardiovascular:negative Respiratory:negative Gastrointestinheartburn Genitourinary:negative Musculoskeletal:negative Skin:negative Hematolgic/Lymphatic:negative Allergic/Immunologic:negative   Past Medical History:Reviewed  Past Medical History  Surgical History: none Medical Problems: HTN Allergies: nkda Medications: torpol, losartan, baby asa, prilosec   Social History:Reviewed  Social History  Preferred Language: English Race:  White Ethnicity: Not Hispanic / Latino Age: 65 year Marital Status:  S Alcohol: socially   Smoking Status: Current every day smoker reviewed on 03/31/2016 Started Date:  Packs per week:  Functional Status reviewed on 03/31/2016 ------------------------------------------------ Bathing: Normal Cooking: Normal Dressing: Normal Driving: Normal Eating: Normal Managing Meds: Normal Oral Care: Normal Shopping: Normal Toileting: Normal Transferring: Normal Walking: Normal Cognitive Status reviewed on 03/31/2016 ------------------------------------------------ Attention: Normal Decision Making: Normal Language: Normal Memory: Normal Motor: Normal Perception: Normal Problem Solving: Normal Visual and Spatial: Normal   Family History:Reviewed  Family Health History Mother, Deceased; Cancer unspecified;  Father, Living; Diabetes mellitus, unspecified type;     Objective Information: General:Well  appearing, well nourished in no distress. Head:Atraumatic; no masses; no abnormalities Neck:Supple without lymphadenopathy.  Heart:RRR, no murmur or gallop.  Normal S1, S2.  No S3, S4.  Lungs:CTA bilaterally, no wheezes, rhonchi, rales.  Breathing unlabored. Abdomen:Soft, NT/ND, normal bowel sounds, no HSM, no masses.  No peritoneal signs. deferred to procedure  Assessment:Need for screeening TCS  Diagnoses: V76.51  Z12.11 Screening for malignant neoplasm of colon (Encounter for screening for malignant neoplasm of colon)  Procedures: [id number] - OFFICE OUTPATIENT NEW 30 MINUTES    Plan:  Scheduled for screening TCS on 07/03/16.  Trilyte prescribed.   Patient Education:Alternative treatments to surgery were discussed with patient (and family).Risks and benefits  of procedure incluidng bleeding and perforation were fully explained to the patient (and family) who gave informed consent. Patient/family questions were addressed.  Follow-up:Pending Surgery

## 2016-07-02 ENCOUNTER — Encounter (HOSPITAL_COMMUNITY): Payer: Self-pay | Admitting: Anesthesiology

## 2016-07-03 ENCOUNTER — Ambulatory Visit (HOSPITAL_COMMUNITY)
Admission: RE | Admit: 2016-07-03 | Discharge: 2016-07-03 | Disposition: A | Discharge status: Home / Self Care | Payer: BLUE CROSS/BLUE SHIELD | Source: Ambulatory Visit | Attending: General Surgery | Admitting: General Surgery

## 2016-07-03 ENCOUNTER — Encounter (HOSPITAL_COMMUNITY): Payer: Self-pay

## 2016-07-03 ENCOUNTER — Encounter (HOSPITAL_COMMUNITY)
Admission: RE | Disposition: A | Discharge status: Home / Self Care | Payer: Self-pay | Source: Ambulatory Visit | Attending: General Surgery

## 2016-07-03 DIAGNOSIS — Z79899 Other long term (current) drug therapy: Secondary | ICD-10-CM | POA: Diagnosis not present

## 2016-07-03 DIAGNOSIS — K5730 Diverticulosis of large intestine without perforation or abscess without bleeding: Secondary | ICD-10-CM | POA: Diagnosis not present

## 2016-07-03 DIAGNOSIS — D123 Benign neoplasm of transverse colon: Secondary | ICD-10-CM | POA: Diagnosis not present

## 2016-07-03 DIAGNOSIS — Z1211 Encounter for screening for malignant neoplasm of colon: Secondary | ICD-10-CM | POA: Diagnosis not present

## 2016-07-03 DIAGNOSIS — I10 Essential (primary) hypertension: Secondary | ICD-10-CM | POA: Insufficient documentation

## 2016-07-03 DIAGNOSIS — D125 Benign neoplasm of sigmoid colon: Secondary | ICD-10-CM | POA: Insufficient documentation

## 2016-07-03 DIAGNOSIS — Z7982 Long term (current) use of aspirin: Secondary | ICD-10-CM | POA: Insufficient documentation

## 2016-07-03 HISTORY — PX: COLONOSCOPY: SHX5424

## 2016-07-03 SURGERY — COLONOSCOPY
Anesthesia: Moderate Sedation

## 2016-07-03 MED ORDER — MEPERIDINE HCL 50 MG/ML IJ SOLN
INTRAMUSCULAR | Status: AC
  Filled 2016-07-03: qty 1

## 2016-07-03 MED ORDER — MIDAZOLAM HCL 5 MG/5ML IJ SOLN
INTRAMUSCULAR | Status: DC | PRN
  Administered 2016-07-03: 1 mg via INTRAVENOUS
  Administered 2016-07-03: 4 mg via INTRAVENOUS

## 2016-07-03 MED ORDER — MEPERIDINE HCL 50 MG/ML IJ SOLN
INTRAMUSCULAR | Status: DC | PRN
  Administered 2016-07-03: 50 mg via INTRAVENOUS
  Administered 2016-07-03: 25 mg via INTRAVENOUS

## 2016-07-03 MED ORDER — MIDAZOLAM HCL 5 MG/5ML IJ SOLN
INTRAMUSCULAR | Status: AC
  Filled 2016-07-03: qty 10

## 2016-07-03 MED ORDER — SODIUM CHLORIDE 0.9 % IV SOLN
INTRAVENOUS | Status: DC
  Administered 2016-07-03: 07:00:00 via INTRAVENOUS

## 2016-07-03 NOTE — Interval H&P Note (Signed)
History and Physical Interval Note:  07/03/2016 7:25 AM  Jacob Lester  has presented today for surgery, with the diagnosis of screening  The various methods of treatment have been discussed with the patient and family. After consideration of risks, benefits and other options for treatment, the patient has consented to  Procedure(s): COLONOSCOPY (N/A) as a surgical intervention .  The patient's history has been reviewed, patient examined, no change in status, stable for surgery.  I have reviewed the patient's chart and labs.  Questions were answered to the patient's satisfaction.     Aviva Signs A

## 2016-07-03 NOTE — Op Note (Signed)
[facility] Patient Name: Jacob Lester Procedure Date: 07/03/2016 7:21 AM MRN: [id number] Date of Birth: 02-20-1966 Attending MD: Aviva Signs , MD CSN: [id number] Age: 50 Admit Type: Outpatient Procedure:                Colonoscopy Indications:              Screening for colorectal malignant neoplasm Providers:                Aviva Signs, MD, Janeece Riggers, RN, Isabella Stalling,                            Technician Referring MD:              Medicines:                Midazolam 6 mg IV, Meperidine 75 mg IV Complications:            No immediate complications. Estimated Blood Loss:     Estimated blood loss: none. Procedure:                Pre-Anesthesia Assessment:                           - Prior to the procedure, a History and Physical                            was performed, and patient medications and                            allergies were reviewed. The patient is unable to                            give consent secondary to the patient's altered                            mental status. The risks and benefits of the                            procedure and the sedation options and risks were                            discussed with the patient. All questions were                            answered and informed consent was obtained. Patient                            identification and proposed procedure were verified                            by the physician and the nurse in the procedure                            room. Mental Status Examination: alert and  oriented. Airway Examination: normal oropharyngeal                            airway and neck mobility. Respiratory Examination:                            clear to auscultation. CV Examination: RRR, no                            murmurs, no S3 or S4. Prophylactic Antibiotics: The                            patient does not require prophylactic antibiotics.                             Prior Anticoagulants: The patient has taken                            aspirin. ASA Grade Assessment: II - A patient with                            mild systemic disease. After reviewing the risks                            and benefits, the patient was deemed in                            satisfactory condition to undergo the procedure.                            The anesthesia plan was to use moderate sedation /                            analgesia (conscious sedation). Immediately prior                            to administration of medications, the patient was                            re-assessed for adequacy to receive sedatives. The                            heart rate, respiratory rate, oxygen saturations,                            blood pressure, adequacy of pulmonary ventilation,                            and response to care were monitored throughout the                            procedure. The physical status of the patient was  re-assessed after the procedure.                           After obtaining informed consent, the colonoscope                            was passed under direct vision. Throughout the                            procedure, the patient's blood pressure, pulse, and                            oxygen saturations were monitored continuously. The                            [id number] scope was introduced through                            the anus and advanced to the the cecum, identified                            by the appendiceal orifice, ileocecal valve and                            palpation. The colonoscopy was performed with ease.                            The patient tolerated the procedure well. The                            quality of the bowel preparation was adequate. No                            anatomical landmarks were photographed. The total                            duration of the procedure was 21  minutes. Scope In: 7:30:10 AM Scope Out: [id number] AM Scope Withdrawal Time: 0 hours 11 minutes 3 seconds  Total Procedure Duration: 0 hours 16 minutes 47 seconds  Findings:      A 2 mm polyp was found in the mid transverse colon. The polyp was       sessile. The polyp was removed with a hot snare. Resection and retrieval       were complete. Estimated blood loss: none.      A 4 mm polyp was found in the mid sigmoid colon. The polyp was sessile.       The polyp was removed with a hot snare. Resection and retrieval were       complete. Estimated blood loss: none.      Multiple medium-mouthed diverticula were found in the sigmoid colon.      The exam was otherwise without abnormality on direct and retroflexion       views. Impression:               - One 2 mm polyp in the mid transverse colon,  removed with a hot snare. Resected and retrieved.                           - One 4 mm polyp in the mid sigmoid colon, removed                            with a hot snare. Resected and retrieved.                           - Moderate diverticulosis in the sigmoid colon.                           - The examination was otherwise normal on direct                            and retroflexion views. Moderate Sedation:      Moderate (conscious) sedation was administered by the endoscopy nurse       and supervised by the endoscopist. The following parameters were       monitored: oxygen saturation, heart rate, blood pressure, and response       to care. Total physician intraservice time was 21 minutes. Recommendation:           - Written discharge instructions were provided to                            the patient.                           - The signs and symptoms of potential delayed                            complications were discussed with the patient.                           - Patient has a contact number available for                            emergencies.                            - Return to normal activities tomorrow.                           - Resume previous diet.                           - Continue present medications.                           - Await pathology results.                           - Repeat colonoscopy for surveillance based on                            pathology results. Procedure Code(s):        ---  Professional ---                           [phone], Colonoscopy, flexible; with removal of                            tumor(s), polyp(s), or other lesion(s) by snare                            technique                           99152, Moderate sedation services provided by the                            same physician or other qualified health care                            professional performing the diagnostic or                            therapeutic service that the sedation supports,                            requiring the presence of an independent trained                            observer to assist in the monitoring of the                            patient's level of consciousness and physiological                            status; initial 15 minutes of intraservice time,                            patient age 29 years or older Diagnosis Code(s):        --- Professional ---                           Z12.11, Encounter for screening for malignant                            neoplasm of colon                           D12.3, Benign neoplasm of transverse colon (hepatic                            flexure or splenic flexure)                           D12.5, Benign neoplasm of sigmoid colon                           K57.30, Diverticulosis of large intestine without  perforation or abscess without bleeding CPT copyright 2016 [employer]. All rights reserved. The codes documented in this report are preliminary and upon coder review may  be revised to meet current compliance  requirements. Aviva Signs, MD Aviva Signs, MD 07/03/2016 7:57:01 AM This report has been signed electronically. Number of Addenda: 0

## 2016-07-03 NOTE — Discharge Instructions (Signed)
Colon Polyps Polyps are lumps of extra tissue growing inside the body. Polyps can grow in the large intestine (colon). Most colon polyps are noncancerous (benign). However, some colon polyps can become cancerous over time. Polyps that are larger than a pea may be harmful. To be safe, caregivers remove and test all polyps. CAUSES  Polyps form when mutations in the genes cause your cells to grow and divide even though no more tissue is needed. RISK FACTORS There are a number of risk factors that can increase your chances of getting colon polyps. They include:  Being older than 50 years.  Family history of colon polyps or colon cancer.  Long-term colon diseases, such as colitis or Crohn disease.  Being overweight.  Smoking.  Being inactive.  Drinking too much alcohol. SYMPTOMS  Most small polyps do not cause symptoms. If symptoms are present, they may include:  Blood in the stool. The stool may look dark red or black.  Constipation or diarrhea that lasts longer than 1 week. DIAGNOSIS People often do not know they have polyps until their caregiver finds them during a regular checkup. Your caregiver can use 4 tests to check for polyps:  Digital rectal exam. The caregiver wears gloves and feels inside the rectum. This test would find polyps only in the rectum.  Barium enema. The caregiver puts a liquid called barium into your rectum before taking X-rays of your colon. Barium makes your colon look white. Polyps are dark, so they are easy to see in the X-ray pictures.  Sigmoidoscopy. A thin, flexible tube (sigmoidoscope) is placed into your rectum. The sigmoidoscope has a light and tiny camera in it. The caregiver uses the sigmoidoscope to look at the last third of your colon.  Colonoscopy. This test is like sigmoidoscopy, but the caregiver looks at the entire colon. This is the most common method for finding and removing polyps. TREATMENT  Any polyps will be removed during a  sigmoidoscopy or colonoscopy. The polyps are then tested for cancer. PREVENTION  To help lower your risk of getting more colon polyps:  Eat plenty of fruits and vegetables. Avoid eating fatty foods.  Do not smoke.  Avoid drinking alcohol.  Exercise every day.  Lose weight if recommended by your caregiver.  Eat plenty of calcium and folate. Foods that are rich in calcium include milk, cheese, and broccoli. Foods that are rich in folate include chickpeas, kidney beans, and spinach. HOME CARE INSTRUCTIONS Keep all follow-up appointments as directed by your caregiver. You may need periodic exams to check for polyps. SEEK MEDICAL CARE IF: You notice bleeding during a bowel movement.   This information is not intended to replace advice given to you by your health care provider. Make sure you discuss any questions you have with your health care provider.   Document Released: 07/01/2004 Document Revised: 10/26/2014 Document Reviewed: 12/15/2011 Elsevier Interactive Patient Education 2016 [employer]. Diverticulosis Diverticulosis is the condition that develops when small pouches (diverticula) form in the wall of your colon. Your colon, or large intestine, is where water is absorbed and stool is formed. The pouches form when the inside layer of your colon pushes through weak spots in the outer layers of your colon. CAUSES  No one knows exactly what causes diverticulosis. RISK FACTORS  Being older than 20. Your risk for this condition increases with age. Diverticulosis is rare in people younger than 40 years. By age 81, almost everyone has it.  Eating a low-fiber diet.  Being frequently constipated.  Being overweight.  Not getting enough exercise.  Smoking.  Taking over-the-counter pain medicines, like aspirin and ibuprofen. SYMPTOMS  Most people with diverticulosis do not have symptoms. DIAGNOSIS  Because diverticulosis often has no symptoms, health care providers often  discover the condition during an exam for other colon problems. In many cases, a health care provider will diagnose diverticulosis while using a flexible scope to examine the colon (colonoscopy). TREATMENT  If you have never developed an infection related to diverticulosis, you may not need treatment. If you have had an infection before, treatment may include:  Eating more fruits, vegetables, and grains.  Taking a fiber supplement.  Taking a live bacteria supplement (probiotic).  Taking medicine to relax your colon. HOME CARE INSTRUCTIONS   Drink at least 6-8 glasses of water each day to prevent constipation.  Try not to strain when you have a bowel movement.  Keep all follow-up appointments. If you have had an infection before:  Increase the fiber in your diet as directed by your health care provider or dietitian.  Take a dietary fiber supplement if your health care provider approves.  Only take medicines as directed by your health care provider. SEEK MEDICAL CARE IF:   You have abdominal pain.  You have bloating.  You have cramps.  You have not gone to the bathroom in 3 days. SEEK IMMEDIATE MEDICAL CARE IF:   Your pain gets worse.  Yourbloating becomes very bad.  You have a fever or chills, and your symptoms suddenly get worse.  You begin vomiting.  You have bowel movements that are bloody or black. MAKE SURE YOU:  Understand these instructions.  Will watch your condition.  Will get help right away if you are not doing well or get worse.   This information is not intended to replace advice given to you by your health care provider. Make sure you discuss any questions you have with your health care provider.   Document Released: 07/02/2004 Document Revised: 10/10/2013 Document Reviewed: 08/30/2013 Elsevier Interactive Patient Education 2016 [employer] Colonoscopy, Care After Refer to this sheet in the next few weeks. These instructions provide you with  information on caring for yourself after your procedure. Your health care provider may also give you more specific instructions. Your treatment has been planned according to current medical practices, but problems sometimes occur. Call your health care provider if you have any problems or questions after your procedure. WHAT TO EXPECT AFTER THE PROCEDURE  After your procedure, it is typical to have the following:  A small amount of blood in your stool.  Moderate amounts of gas and mild abdominal cramping or bloating. HOME CARE INSTRUCTIONS  Do not drive, operate machinery, or sign important documents for 24 hours.  You may shower and resume your regular physical activities, but move at a slower pace for the first 24 hours.  Take frequent rest periods for the first 24 hours.  Walk around or put a warm pack on your abdomen to help reduce abdominal cramping and bloating.  Drink enough fluids to keep your urine clear or pale yellow.  You may resume your normal diet as instructed by your health care provider. Avoid heavy or fried foods that are hard to digest.  Avoid drinking alcohol for 24 hours or as instructed by your health care provider.  Only take over-the-counter or prescription medicines as directed by your health care provider.  If a tissue sample (biopsy) was taken during your procedure:  Do not take aspirin  or blood thinners for 7 days, or as instructed by your health care provider.  Do not drink alcohol for 7 days, or as instructed by your health care provider.  Eat soft foods for the first 24 hours. SEEK MEDICAL CARE IF: You have persistent spotting of blood in your stool 2-3 days after the procedure. SEEK IMMEDIATE MEDICAL CARE IF:  You have more than a small spotting of blood in your stool.  You pass large blood clots in your stool.  Your abdomen is swollen (distended).  You have nausea or vomiting.  You have a fever.  You have increasing abdominal pain that is  not relieved with medicine.   This information is not intended to replace advice given to you by your health care provider. Make sure you discuss any questions you have with your health care provider.   Document Released: 05/19/2004 Document Revised: 07/26/2013 Document Reviewed: 06/12/2013 Elsevier Interactive Patient Education [employer].

## 2016-07-14 ENCOUNTER — Encounter (HOSPITAL_COMMUNITY): Payer: Self-pay | Admitting: General Surgery

## 2019-12-17 ENCOUNTER — Ambulatory Visit: Payer: BC Managed Care – PPO | Attending: Internal Medicine

## 2019-12-17 DIAGNOSIS — Z23 Encounter for immunization: Secondary | ICD-10-CM | POA: Insufficient documentation

## 2019-12-17 NOTE — Progress Notes (Signed)
   Covid-19 Vaccination Clinic  Name:  Jacob Lester    MRN: [id number] DOB: 09-30-1966  12/17/2019  Mr. Jacob Lester was observed post Covid-19 immunization for 15 minutes without incidence. He was provided with Vaccine Information Sheet and instruction to access the V-Safe system.   Mr. Jacob Lester was instructed to call 911 with any severe reactions post vaccine: Marland Kitchen Difficulty breathing  . Swelling of your face and throat  . A fast heartbeat  . A bad rash all over your body  . Dizziness and weakness    Immunizations Administered    Name Date Dose VIS Date Route   Moderna COVID-19 Vaccine 12/17/2019  5:20 PM 0.5 mL 09/19/2019 Intramuscular   Manufacturer: Moderna   Lot: [id number]   [id number]

## 2020-01-20 ENCOUNTER — Ambulatory Visit: Payer: BC Managed Care – PPO | Attending: Internal Medicine

## 2020-01-20 DIAGNOSIS — Z23 Encounter for immunization: Secondary | ICD-10-CM

## 2020-01-20 NOTE — Progress Notes (Signed)
   Covid-19 Vaccination Clinic  Name:  SKEET ROSENBOOM    MRN: [id number] DOB: [birth year]  01/20/2020  Mr. Foronda was observed post Covid-19 immunization for 15 minutes without incident. He was provided with Vaccine Information Sheet and instruction to access the V-Safe system.   Mr. Desalvo was instructed to call 911 with any severe reactions post vaccine: Marland Kitchen Difficulty breathing  . Swelling of face and throat  . A fast heartbeat  . A bad rash all over body  . Dizziness and weakness   Immunizations Administered    Name Date Dose VIS Date Route   Moderna COVID-19 Vaccine 01/20/2020 12:22 PM 0.5 mL 09/19/2019 Intramuscular   Manufacturer: Moderna   Lot: [id number]   [id number]

## 2020-02-27 ENCOUNTER — Encounter (HOSPITAL_COMMUNITY): Payer: Self-pay

## 2020-02-27 ENCOUNTER — Other Ambulatory Visit: Payer: Self-pay

## 2020-02-27 ENCOUNTER — Emergency Department (HOSPITAL_COMMUNITY)
Admission: EM | Admit: 2020-02-27 | Discharge: 2020-02-27 | Disposition: A | Discharge status: Home / Self Care | Payer: BC Managed Care – PPO | Attending: Emergency Medicine | Admitting: Emergency Medicine

## 2020-02-27 DIAGNOSIS — Z7982 Long term (current) use of aspirin: Secondary | ICD-10-CM | POA: Insufficient documentation

## 2020-02-27 DIAGNOSIS — E871 Hypo-osmolality and hyponatremia: Secondary | ICD-10-CM | POA: Insufficient documentation

## 2020-02-27 DIAGNOSIS — Z79899 Other long term (current) drug therapy: Secondary | ICD-10-CM | POA: Diagnosis not present

## 2020-02-27 DIAGNOSIS — K7469 Other cirrhosis of liver: Secondary | ICD-10-CM | POA: Insufficient documentation

## 2020-02-27 DIAGNOSIS — E876 Hypokalemia: Secondary | ICD-10-CM | POA: Insufficient documentation

## 2020-02-27 DIAGNOSIS — D696 Thrombocytopenia, unspecified: Secondary | ICD-10-CM | POA: Insufficient documentation

## 2020-02-27 DIAGNOSIS — F17210 Nicotine dependence, cigarettes, uncomplicated: Secondary | ICD-10-CM | POA: Insufficient documentation

## 2020-02-27 DIAGNOSIS — N5089 Other specified disorders of the male genital organs: Secondary | ICD-10-CM | POA: Diagnosis not present

## 2020-02-27 DIAGNOSIS — I10 Essential (primary) hypertension: Secondary | ICD-10-CM | POA: Insufficient documentation

## 2020-02-27 DIAGNOSIS — D649 Anemia, unspecified: Secondary | ICD-10-CM | POA: Diagnosis present

## 2020-02-27 HISTORY — DX: Unspecified cirrhosis of liver: K74.60

## 2020-02-27 LAB — CBC WITH DIFFERENTIAL/PLATELET
Abs Immature Granulocytes: 0.02 10*3/uL (ref 0.00–0.07)
Basophils Absolute: 0 10*3/uL (ref 0.0–0.1)
Basophils Relative: 1 %
Eosinophils Absolute: 0.2 10*3/uL (ref 0.0–0.5)
Eosinophils Relative: 5 %
HCT: 30.9 % — ABNORMAL LOW (ref 39.0–52.0)
Hemoglobin: 9.9 g/dL — ABNORMAL LOW (ref 13.0–17.0)
Immature Granulocytes: 1 %
Lymphocytes Relative: 18 %
Lymphs Abs: 0.7 10*3/uL (ref 0.7–4.0)
MCH: 29.7 pg (ref 26.0–34.0)
MCHC: 32 g/dL (ref 30.0–36.0)
MCV: 92.8 fL (ref 80.0–100.0)
Monocytes Absolute: 0.5 10*3/uL (ref 0.1–1.0)
Monocytes Relative: 13 %
Neutro Abs: 2.2 10*3/uL (ref 1.7–7.7)
Neutrophils Relative %: 62 %
Platelets: 45 10*3/uL — ABNORMAL LOW (ref 150–400)
RBC: 3.33 MIL/uL — ABNORMAL LOW (ref 4.22–5.81)
RDW: 16.3 % — ABNORMAL HIGH (ref 11.5–15.5)
WBC: 3.6 10*3/uL — ABNORMAL LOW (ref 4.0–10.5)
nRBC: 0 % (ref 0.0–0.2)

## 2020-02-27 LAB — COMPREHENSIVE METABOLIC PANEL
ALT: 31 U/L (ref 0–44)
AST: 78 U/L — ABNORMAL HIGH (ref 15–41)
Albumin: 2.9 g/dL — ABNORMAL LOW (ref 3.5–5.0)
Alkaline Phosphatase: 106 U/L (ref 38–126)
Anion gap: 8 (ref 5–15)
BUN: 10 mg/dL (ref 6–20)
CO2: 25 mmol/L (ref 22–32)
Calcium: 8.5 mg/dL — ABNORMAL LOW (ref 8.9–10.3)
Chloride: 99 mmol/L (ref 98–111)
Creatinine, Ser: 0.68 mg/dL (ref 0.61–1.24)
GFR calc Af Amer: >60 mL/min (ref >60)
GFR calc non Af Amer: >60 mL/min (ref >60)
Glucose, Bld: 113 mg/dL — ABNORMAL HIGH (ref 70–99)
Potassium: 3.2 mmol/L — ABNORMAL LOW (ref 3.5–5.1)
Sodium: 132 mmol/L — ABNORMAL LOW (ref 135–145)
Total Bilirubin: 4.3 mg/dL — ABNORMAL HIGH (ref 0.3–1.2)
Total Protein: 6.3 g/dL — ABNORMAL LOW (ref 6.5–8.1)

## 2020-02-27 MED ORDER — FUROSEMIDE 20 MG PO TABS: 20.0000 mg | ORAL_TABLET | Freq: Every day | ORAL | 0 refills | Status: AC

## 2020-02-27 MED ORDER — POTASSIUM CHLORIDE ER 10 MEQ PO TBCR: 10.0000 meq | EXTENDED_RELEASE_TABLET | Freq: Every day | ORAL | 0 refills | Status: AC

## 2020-02-27 MED ORDER — FUROSEMIDE 40 MG PO TABS
40.0000 mg | ORAL_TABLET | Freq: Once | ORAL | Status: AC
  Administered 2020-02-27: 40 mg via ORAL
  Filled 2020-02-27: qty 1

## 2020-02-27 NOTE — Discharge Instructions (Addendum)
Anemia with low platelets to need to take Prilosec as prescribed follow-up with GI doctor within the next week.  For scrotal edema continue to take Lasix for 1 week with the potassium replacement

## 2020-02-27 NOTE — ED Provider Notes (Signed)
[facility] EMERGENCY DEPARTMENT Provider Note   CSN: [id number] Arrival date & time: 02/27/20  1502     History Chief Complaint  Patient presents with   Groin Swelling    JOHNROSS HAJNY is a 54 y.o. male. Chief complaint of scrotal swelling and groin pain HPI Patient has significant medical history of cirrhosis, hypertension, enlargement of the spleen GERD, and presents with groin pain and scrotal swelling.  Patient explains that the scrotal swelling started last Wednesday because he could not refill his prescription of Losartan/HCTZ.  Patient saw his primary care doctor last Wednesday. He started him back on  Losartan/HCTZ. and then referred him to a urologist for the scrotal swelling. Patient had an ultrasound done on 05/06 showing bilateral varicoceles and a right hydrocele.  Patient also had a CT abdominal without contrast and it showed cirrhosis with portal venous hypertension, diverticulosis and cholelithaisis. Patient admits since taking his diuretics that the swelling has gone down but he is still in some discomfort because his scrotum is rubbing against his pants.  He has been putting cornstarch which helps with the pain. The scrotal swelling is worse when he is standing but improves when he is laying down.  Patient also admits that his legs have become swollen since missing his diuretics but they have gotten better since she has restarted them.  Patient denies dizziness headache congestion sore throat chest pain difficulty breathing, abdominal pain, difficulty urinating or defecation.     Past Medical History:  Diagnosis Date   Cirrhosis of liver ([place])    Enlargement of spleen    Fatty liver    GERD (gastroesophageal reflux disease)    Hypertension    Hypertension     Patient Active Problem List   Diagnosis Date Noted   Cirrhosis ([place]) 06/28/2012   Thrombocytopenia ([place]) 06/28/2012   GERD (gastroesophageal reflux disease) 06/28/2012   MONONEURITIS  09/16/2009   HIGH BLOOD PRESSURE 09/16/2009    Past Surgical History:  Procedure Laterality Date   COLONOSCOPY N/A 07/03/2016   Procedure: COLONOSCOPY;  Surgeon: Aviva Signs, MD;  Location: [facility];  Service: Gastroenterology;  Laterality: N/A;       Family History  Problem Relation Age of Onset   Cancer Mother    Cancer Father    Healthy Brother    Healthy Brother    Healthy Daughter    Healthy Daughter    Healthy Daughter     Social History   Tobacco Use   Smoking status: Current Every Day Smoker    Packs/day: 0.50    Types: Cigarettes    Last attempt to quit: 06/29/2003    Years since quitting: 16.6   Smokeless tobacco: Never Used  Substance Use Topics   Alcohol use: Yes   Drug use: No    Home Medications Prior to Admission medications   Medication Sig Start Date End Date Taking? Authorizing Provider  aspirin EC 81 MG tablet Take 81 mg by mouth daily.    [provider]  Flaxseed, Linseed, (FLAXSEED OIL) 1200 MG CAPS Take by mouth daily.    [provider]  LORazepam (ATIVAN) 0.5 MG tablet Take 1 tablet (0.5 mg total) by mouth every 8 (eight) hours as needed for anxiety. 10/31/12   Rehman, Mechele Dawley, MD  losartan-hydrochlorothiazide (HYZAAR) 100-25 MG per tablet Take 1 tablet by mouth daily.  08/11/13   [provider]  metoprolol succinate (TOPROL-XL) 100 MG 24 hr tablet Take 100 mg by mouth daily.  [provider]  naphazoline (CLEAR EYES) 0.012 % ophthalmic solution Place 1 drop into both eyes daily.    [provider]  Omega-3 Fatty Acids (FISH OIL) 1200 MG CAPS Take 1 capsule by mouth daily.    [provider]  omeprazole (PRILOSEC) 20 MG capsule Take 20 mg by mouth every other day.    [provider]    Allergies    Patient has no known allergies.  Review of Systems   Review of Systems  Constitutional: Negative for chills and fever.  HENT: Negative for congestion.     Respiratory: Negative for shortness of breath.   Cardiovascular: Negative for chest pain.  Gastrointestinal: Negative for abdominal pain.  Genitourinary: Positive for scrotal swelling. Negative for difficulty urinating, discharge, dysuria, enuresis, flank pain, hematuria and testicular pain.  Musculoskeletal: Negative for back pain.  Skin: Negative for rash.  Neurological: Negative for dizziness.  Hematological: Does not bruise/bleed easily.    Physical Exam Updated Vital Signs BP (!) 141/73 (BP Location: Right Arm)    Pulse 89    Temp 97.8 F (36.6 C) (Oral)    Resp 16    Ht 5\' 11"  (1.803 m)    Wt 98.4 kg    SpO2 100%    BMI 30.27 kg/m   Physical Exam Vitals and nursing note reviewed.  Constitutional:      General: He is not in acute distress.    Appearance: He is not ill-appearing.  HENT:     Head: Normocephalic and atraumatic.     Nose: No congestion.     Mouth/Throat:     Mouth: Mucous membranes are moist.     Pharynx: Oropharynx is clear.  Eyes:     General: No scleral icterus. Cardiovascular:     Rate and Rhythm: Normal rate and regular rhythm.     Pulses: Normal pulses.     Heart sounds: No murmur. No friction rub. No gallop.   Pulmonary:     Effort: No respiratory distress.     Breath sounds: No wheezing, rhonchi or rales.  Abdominal:     General: There is no distension.     Tenderness: There is no abdominal tenderness. There is no guarding.  Genitourinary:    Comments: Scrotum is very swollen about 8 inches in diameter and has some skin breakdown on the left upper side of the scrotum.  The shaft of the penis was visualized and there appeared to be no abnormalities.  Musculoskeletal:        General: No swelling.     Right lower leg: Edema present.     Left lower leg: Edema present.  Skin:    General: Skin is warm and dry.     Findings: No rash.  Neurological:     Mental Status: He is alert.  Psychiatric:        Mood and Affect: Mood normal.     ED  Results / Procedures / Treatments   Labs (all labs ordered are listed, but only abnormal results are displayed) Labs Reviewed  COMPREHENSIVE METABOLIC PANEL - Abnormal; Notable for the following components:      Result Value   Sodium 132 (*)    Potassium 3.2 (*)    Glucose, Bld 113 (*)    Calcium 8.5 (*)    Total Protein 6.3 (*)    Albumin 2.9 (*)    AST 78 (*)    Total Bilirubin 4.3 (*)    All other components within normal  limits  CBC WITH DIFFERENTIAL/PLATELET - Abnormal; Notable for the following components:   WBC 3.6 (*)    RBC 3.33 (*)    Hemoglobin 9.9 (*)    HCT 30.9 (*)    RDW 16.3 (*)    Platelets 45 (*)    All other components within normal limits    EKG EKG Interpretation  Date/Time:  Tuesday Feb 27 2020 17:10:27 EDT Ventricular Rate:  81 PR Interval:    QRS Duration: 104 QT Interval:  430 QTC Calculation: 500 R Axis:   -27 Text Interpretation: Sinus rhythm Borderline left axis deviation Borderline prolonged QT interval Baseline wander in lead(s) V4 Confirmed by Noemi Chapel [id number]) on 02/27/2020 7:10:56 PM   Radiology No results found.  Procedures Procedures (including critical care time)  Medications Ordered in ED Medications  furosemide (LASIX) tablet 40 mg (has no administration in time range)    ED Course  I have reviewed the triage vital signs and the nursing notes.  Pertinent labs & imaging results that were available during my care of the patient were reviewed by me and considered in my medical decision making (see chart for details).  Patient has significant medical history of cirrhosis, hypertension and presents with groin pain and scrotal swelling.  Patient had an ultrasound done on 05/06 showing bilateral varicoceles and a right hydrocele.  Patient also had a CT abdominal on 05/06 without contrast and it showed cirrhosis with portal venous hypertension, diverticulosis and cholelithaisis.  Patient's CMP showed that he was hyponatremic,  hypokalemic, and albumin level of 2.9 elevated AST of 78 and increased bilirubin of 4.3.  Patient's CBC showed he does not have leukocytosis but does have  normocytic anemia and low platelets of 45. patient's Hemoccult was positive. Patient was in no obvious distress and is hemodynamic stable. He was explained these results and he verbalized that he understood them.  It is likely that the patient's symptoms are a result of fluid overload and cirrhosis.  It is recommended that the patient takes Lasix and oral potassium supplement and he follows up with his GI specialist within the next week   Hemoglobin  Date Value Ref Range Status  02/27/2020 9.9 (L) 13.0 - 17.0 g/dL Final  04/12/2013 14.7 13.0 - 17.0 g/dL Final  12/15/2012 15.6 13.0 - 17.0 g/dL Final  10/25/2012 15.1 13.0 - 17.0 g/dL Final    Vitals:   02/27/20 1525 02/27/20 1730 02/27/20 1813 02/27/20 1830  BP:   (!) 144/68 136/67  Pulse:  82 76 80  Resp:  13 14 (!) 9  Temp:      TempSrc:      SpO2:  100% 99% 100%  Weight: 98.4 kg     Height: 5\' 11"  (1.803 m)         MDM Rules/Calculators/A&P                       Final Clinical Impression(s) / ED Diagnoses Final diagnoses:  Hyponatremia  Hypokalemia  Anemia with low platelet count (HCC)  Other cirrhosis of liver (HCC)  Scrotal edema    Rx / DC Orders ED Discharge Orders         Ordered    furosemide (LASIX) 20 MG tablet  Daily     02/27/20 2004    potassium chloride (KLOR-CON) 10 MEQ tablet  Daily     02/27/20 2004           Marcello Fennel, [place] 02/27/20 2018  Noemi Chapel, MD 02/28/20 1028

## 2020-02-27 NOTE — ED Triage Notes (Signed)
Pt presents to ED with scrotal swelling. Pt sees urology in [place] and was told to come to ED for evaluation. Pt had [place] and CT last Thursday.

## 2020-02-27 NOTE — ED Notes (Signed)
Pt out of diuretic for the last 10 days   Now with bilateral leg swelling as well as scrotum the size of a soccer ball   Monitor, EKG   PA has seen

## 2020-02-27 NOTE — ED Notes (Signed)
In spite of diuretic, pt has yet to void  Urinal at bedside with instructions of need to have I&O

## 2020-02-28 LAB — POC OCCULT BLOOD, ED: Fecal Occult Bld: POSITIVE — AB

## 2020-05-27 ENCOUNTER — Telehealth: Payer: Self-pay | Admitting: Gastroenterology

## 2020-05-27 NOTE — Telephone Encounter (Signed)
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 Pt has seen [facility] GI and would like to transfer care to [place].  Pt's records are in epic for review , please advise on scheduling

## 2020-05-29 NOTE — Telephone Encounter (Signed)
Sorry cant accommodate the request at this point, I dont have any available appointments for next several weeks.He should continue with his current GI physician or please check if anyone else in the practice can take him on? Thanks VN

## 2020-06-19 DEATH — deceased
